# Patient Record
Sex: Male | Born: 1960 | Race: Black or African American | Hispanic: No | Marital: Married | Smoking: Never smoker
Health system: Southern US, Community
[De-identification: ages and names within clinical notes are randomized; demographics above are authoritative.]

## PROBLEM LIST (undated history)

## (undated) DIAGNOSIS — E119 Type 2 diabetes mellitus without complications: Secondary | ICD-10-CM

## (undated) DIAGNOSIS — R7309 Other abnormal glucose: Secondary | ICD-10-CM

## (undated) DIAGNOSIS — I4892 Unspecified atrial flutter: Secondary | ICD-10-CM

## (undated) DIAGNOSIS — I10 Essential (primary) hypertension: Secondary | ICD-10-CM

## (undated) DIAGNOSIS — E785 Hyperlipidemia, unspecified: Secondary | ICD-10-CM

## (undated) HISTORY — DX: Type 2 diabetes mellitus without complications: E11.9

## (undated) HISTORY — DX: Essential (primary) hypertension: I10

## (undated) HISTORY — PX: ARTHROSCOPY KNEE W/ DRILLING: SUR92

## (undated) HISTORY — PX: COLONOSCOPY: SHX174

## (undated) HISTORY — DX: Hyperlipidemia, unspecified: E78.5

## (undated) HISTORY — DX: Unspecified atrial flutter: I48.92

---

## 2003-10-04 ENCOUNTER — Emergency Department (HOSPITAL_COMMUNITY): Admission: EM | Admit: 2003-10-04 | Discharge: 2003-10-05 | Payer: Self-pay | Admitting: Internal Medicine

## 2009-11-18 ENCOUNTER — Emergency Department (HOSPITAL_COMMUNITY): Admission: EM | Admit: 2009-11-18 | Discharge: 2009-11-18 | Payer: Self-pay | Admitting: Family Medicine

## 2009-11-23 ENCOUNTER — Ambulatory Visit (HOSPITAL_COMMUNITY): Admission: RE | Admit: 2009-11-23 | Discharge: 2009-11-23 | Payer: Self-pay | Admitting: Family Medicine

## 2009-11-29 ENCOUNTER — Encounter (HOSPITAL_COMMUNITY): Admission: RE | Admit: 2009-11-29 | Discharge: 2009-12-29 | Payer: Self-pay | Admitting: Family Medicine

## 2013-01-28 ENCOUNTER — Other Ambulatory Visit: Payer: Self-pay | Admitting: *Deleted

## 2013-01-28 MED ORDER — AMLODIPINE BESY-BENAZEPRIL HCL 10-20 MG PO CAPS: 1.0000 | ORAL_CAPSULE | Freq: Every day | ORAL | Status: DC

## 2013-01-28 MED ORDER — PRAVASTATIN SODIUM 80 MG PO TABS: 80.0000 mg | ORAL_TABLET | Freq: Every evening | ORAL | Status: DC

## 2013-01-28 MED ORDER — HYDROCHLOROTHIAZIDE 25 MG PO TABS: 25.0000 mg | ORAL_TABLET | Freq: Every day | ORAL | Status: DC

## 2013-01-29 ENCOUNTER — Encounter: Payer: Self-pay | Admitting: *Deleted

## 2013-01-29 DIAGNOSIS — E782 Mixed hyperlipidemia: Secondary | ICD-10-CM | POA: Insufficient documentation

## 2013-01-29 DIAGNOSIS — E669 Obesity, unspecified: Secondary | ICD-10-CM

## 2013-01-29 DIAGNOSIS — E785 Hyperlipidemia, unspecified: Secondary | ICD-10-CM

## 2013-01-29 DIAGNOSIS — I10 Essential (primary) hypertension: Secondary | ICD-10-CM

## 2013-04-01 ENCOUNTER — Other Ambulatory Visit: Payer: Self-pay | Admitting: *Deleted

## 2013-04-01 MED ORDER — AMLODIPINE BESY-BENAZEPRIL HCL 10-20 MG PO CAPS: 1.0000 | ORAL_CAPSULE | Freq: Every day | ORAL | Status: DC

## 2013-04-01 MED ORDER — PRAVASTATIN SODIUM 80 MG PO TABS: 80.0000 mg | ORAL_TABLET | Freq: Every evening | ORAL | Status: DC

## 2013-04-01 MED ORDER — HYDROCHLOROTHIAZIDE 25 MG PO TABS: 25.0000 mg | ORAL_TABLET | Freq: Every day | ORAL | Status: DC

## 2013-05-31 ENCOUNTER — Other Ambulatory Visit: Payer: Self-pay | Admitting: *Deleted

## 2013-05-31 MED ORDER — AMLODIPINE BESY-BENAZEPRIL HCL 10-20 MG PO CAPS: 1.0000 | ORAL_CAPSULE | Freq: Every day | ORAL | Status: DC

## 2013-05-31 MED ORDER — PRAVASTATIN SODIUM 80 MG PO TABS: 80.0000 mg | ORAL_TABLET | Freq: Every evening | ORAL | Status: DC

## 2013-05-31 MED ORDER — HYDROCHLOROTHIAZIDE 25 MG PO TABS: 25.0000 mg | ORAL_TABLET | Freq: Every day | ORAL | Status: DC

## 2013-08-02 ENCOUNTER — Other Ambulatory Visit: Payer: Self-pay | Admitting: Family Medicine

## 2013-09-03 ENCOUNTER — Telehealth: Payer: Self-pay | Admitting: Family Medicine

## 2013-09-03 DIAGNOSIS — E785 Hyperlipidemia, unspecified: Secondary | ICD-10-CM

## 2013-09-03 DIAGNOSIS — Z79899 Other long term (current) drug therapy: Secondary | ICD-10-CM

## 2013-09-03 DIAGNOSIS — Z125 Encounter for screening for malignant neoplasm of prostate: Secondary | ICD-10-CM

## 2013-09-03 NOTE — Telephone Encounter (Signed)
Patient has a Wellness Exam scheduled for September 16, 2013 and would like to have blood work completed prior.  Call patient when to let him know when he can have his this completed

## 2013-09-04 ENCOUNTER — Other Ambulatory Visit: Payer: Self-pay | Admitting: Family Medicine

## 2013-09-08 NOTE — Telephone Encounter (Signed)
Lip liv m7 psa 

## 2013-09-08 NOTE — Telephone Encounter (Signed)
Blood work ordered in Epic. Patient was notified.  

## 2013-09-13 LAB — LIPID PANEL
Cholesterol: 141 mg/dL (ref 0–200)
HDL: 36 mg/dL — ABNORMAL LOW (ref >39)
LDL Cholesterol: 89 mg/dL (ref 0–99)
Total CHOL/HDL Ratio: 3.9 Ratio
Triglycerides: 78 mg/dL (ref <150)
VLDL: 16 mg/dL (ref 0–40)

## 2013-09-13 LAB — BASIC METABOLIC PANEL
BUN: 13 mg/dL (ref 6–23)
CO2: 31 mEq/L (ref 19–32)
Calcium: 9.6 mg/dL (ref 8.4–10.5)
Chloride: 101 mEq/L (ref 96–112)
Creat: 1.14 mg/dL (ref 0.50–1.35)
Glucose, Bld: 119 mg/dL — ABNORMAL HIGH (ref 70–99)
Potassium: 3.9 mEq/L (ref 3.5–5.3)
Sodium: 137 mEq/L (ref 135–145)

## 2013-09-13 LAB — HEPATIC FUNCTION PANEL
ALT: 26 U/L (ref 0–53)
AST: 20 U/L (ref 0–37)
Albumin: 4 g/dL (ref 3.5–5.2)
Alkaline Phosphatase: 47 U/L (ref 39–117)
Bilirubin, Direct: 0.1 mg/dL (ref 0.0–0.3)
Indirect Bilirubin: 0.4 mg/dL (ref 0.0–0.9)
Total Bilirubin: 0.5 mg/dL (ref 0.3–1.2)
Total Protein: 7.3 g/dL (ref 6.0–8.3)

## 2013-09-14 LAB — PSA: PSA: 0.44 ng/mL (ref <=4.00)

## 2013-09-16 ENCOUNTER — Encounter: Payer: Self-pay | Admitting: Family Medicine

## 2013-09-16 ENCOUNTER — Ambulatory Visit (INDEPENDENT_AMBULATORY_CARE_PROVIDER_SITE_OTHER): Payer: Managed Care, Other (non HMO) | Admitting: Family Medicine

## 2013-09-16 VITALS — BP 150/90 | Ht 73.0 in | Wt 277.4 lb

## 2013-09-16 DIAGNOSIS — Z0000 Encounter for general adult medical examination without abnormal findings: Secondary | ICD-10-CM

## 2013-09-16 MED ORDER — AMLODIPINE BESY-BENAZEPRIL HCL 10-20 MG PO CAPS: ORAL_CAPSULE | ORAL | Status: DC

## 2013-09-16 MED ORDER — HYDROCHLOROTHIAZIDE 25 MG PO TABS: ORAL_TABLET | ORAL | Status: DC

## 2013-09-16 MED ORDER — PRAVASTATIN SODIUM 80 MG PO TABS: ORAL_TABLET | ORAL | Status: DC

## 2013-09-16 NOTE — Progress Notes (Signed)
Subjective:    Patient ID: Brian Ewing, male    DOB: June 04, 1961, 52 y.o.   MRN: [id number]  HPI Patient is here today for annual exam.  He needs a refill on medications. Patient admits to only fair compliance with his diet. Trying to watch his diet. Has cut salt down. Not exercising much at this point unfortunately.  Reports up to date on his colonoscopy Do 2020.  No total slight swelling at the ankles at times. Already had the flu vaccine.  No concerns.  Results for orders placed in visit on 09/03/13  LIPID PANEL      Result Value Range   Cholesterol 141  0 - 200 mg/dL   Triglycerides 78  <956 mg/dL   HDL 36 (*) >21 mg/dL   Total CHOL/HDL Ratio 3.9     VLDL 16  0 - 40 mg/dL   LDL Cholesterol 89  0 - 99 mg/dL  HEPATIC FUNCTION PANEL      Result Value Range   Total Bilirubin 0.5  0.3 - 1.2 mg/dL   Bilirubin, Direct 0.1  0.0 - 0.3 mg/dL   Indirect Bilirubin 0.4  0.0 - 0.9 mg/dL   Alkaline Phosphatase 47  39 - 117 U/L   AST 20  0 - 37 U/L   ALT 26  0 - 53 U/L   Total Protein 7.3  6.0 - 8.3 g/dL   Albumin 4.0  3.5 - 5.2 g/dL  BASIC METABOLIC PANEL      Result Value Range   Sodium 137  135 - 145 mEq/L   Potassium 3.9  3.5 - 5.3 mEq/L   Chloride 101  96 - 112 mEq/L   CO2 31  19 - 32 mEq/L   Glucose, Bld 119 (*) 70 - 99 mg/dL   BUN 13  6 - 23 mg/dL   Creat 3.08  6.57 - 8.46 mg/dL   Calcium 9.6  8.4 - 96.2 mg/dL  PSA      Result Value Range   PSA 0.44  <=4.00 ng/mL     Review of Systems  Constitutional: Negative for fever, activity change and appetite change.  HENT: Negative for congestion and rhinorrhea.   Eyes: Negative for discharge.  Respiratory: Negative for cough and wheezing.   Cardiovascular: Negative for chest pain.  Gastrointestinal: Negative for vomiting, abdominal pain and blood in stool.  Genitourinary: Negative for frequency and difficulty urinating.  Musculoskeletal: Negative for neck pain.  Skin: Negative for rash.  Allergic/Immunologic:  Negative for environmental allergies and food allergies.  Neurological: Negative for weakness and headaches.  Psychiatric/Behavioral: Negative for agitation.       Objective:   Physical Exam  Vitals reviewed. Constitutional: He appears well-developed and well-nourished.  Significant obesity present  HENT:  Head: Normocephalic and atraumatic.  Right Ear: External ear normal.  Left Ear: External ear normal.  Nose: Nose normal.  Mouth/Throat: Oropharynx is clear and moist.  Eyes: EOM are normal. Pupils are equal, round, and reactive to light.  Neck: Normal range of motion. Neck supple. No thyromegaly present.  Cardiovascular: Normal rate, regular rhythm and normal heart sounds.   No murmur heard. Pulmonary/Chest: Effort normal and breath sounds normal. No respiratory distress. He has no wheezes.  Abdominal: Soft. Bowel sounds are normal. He exhibits no distension and no mass. There is no tenderness.  Genitourinary: Penis normal.  Musculoskeletal: Normal range of motion. He exhibits no edema.  Ankles trace edema at most  Lymphadenopathy:    He has no  cervical adenopathy.  Neurological: He is alert. He exhibits normal muscle tone.  Skin: Skin is warm and dry. No erythema.  Psychiatric: He has a normal mood and affect. His behavior is normal. Judgment normal.          Assessment & Plan:  Impression 1 wellness exam. #2 hypertension good control. #3 hyperlipidemia decent control. Plan diet exercise discussed in encourage. Already has had flu shot. Patient mentioned at the end of the visit difficulties with erectile dysfunction. I asked him to return for further discussion regarding this. WSL

## 2013-10-19 ENCOUNTER — Ambulatory Visit: Payer: Managed Care, Other (non HMO) | Admitting: Family Medicine

## 2013-10-20 ENCOUNTER — Ambulatory Visit (INDEPENDENT_AMBULATORY_CARE_PROVIDER_SITE_OTHER): Payer: Managed Care, Other (non HMO) | Admitting: Family Medicine

## 2013-10-20 ENCOUNTER — Encounter: Payer: Self-pay | Admitting: Family Medicine

## 2013-10-20 VITALS — BP 130/90 | Ht 73.0 in | Wt 282.5 lb

## 2013-10-20 DIAGNOSIS — N529 Male erectile dysfunction, unspecified: Secondary | ICD-10-CM

## 2013-10-20 NOTE — Progress Notes (Signed)
   Subjective:    Patient ID: Brian Ewing, male    DOB: 24-Mar-1961, 52 y.o.   MRN: [id number]  Erectile Dysfunction This is a new problem. The current episode started more than 1 month ago. The problem is unchanged. The symptoms are aggravated by medications. Past treatments include nothing. The treatment provided no relief. He has had no adverse reactions caused by medications. There are no known risk factors.    Results for orders placed in visit on 09/03/13  LIPID PANEL      Result Value Range   Cholesterol 141  0 - 200 mg/dL   Triglycerides 78  <409 mg/dL   HDL 36 (*) >81 mg/dL   Total CHOL/HDL Ratio 3.9     VLDL 16  0 - 40 mg/dL   LDL Cholesterol 89  0 - 99 mg/dL  HEPATIC FUNCTION PANEL      Result Value Range   Total Bilirubin 0.5  0.3 - 1.2 mg/dL   Bilirubin, Direct 0.1  0.0 - 0.3 mg/dL   Indirect Bilirubin 0.4  0.0 - 0.9 mg/dL   Alkaline Phosphatase 47  39 - 117 U/L   AST 20  0 - 37 U/L   ALT 26  0 - 53 U/L   Total Protein 7.3  6.0 - 8.3 g/dL   Albumin 4.0  3.5 - 5.2 g/dL  BASIC METABOLIC PANEL      Result Value Range   Sodium 137  135 - 145 mEq/L   Potassium 3.9  3.5 - 5.3 mEq/L   Chloride 101  96 - 112 mEq/L   CO2 31  19 - 32 mEq/L   Glucose, Bld 119 (*) 70 - 99 mg/dL   BUN 13  6 - 23 mg/dL   Creat 1.91  4.78 - 2.95 mg/dL   Calcium 9.6  8.4 - 62.1 mg/dL  PSA      Result Value Range   PSA 0.44  <=4.00 ng/mL   No a merections, No levitra no cialis,  Never went to whole 100 mg tab  Misplaced last rx.  No hx test supplement Review of Systems No chest pain no back pain no abdominal pain no change in bowel habits ROS otherwise negative    Objective:   Physical Exam  Alert lungs clear. Heart regular in rhythm. H&T normal.      Assessment & Plan:  Impression rectal dysfunction somewhat worsening. Long discussion held. At this time we'll hold off on testosterone rationale discussed plan Viagra 100 mg #6 one half to one when necessary. WSL

## 2013-11-06 ENCOUNTER — Other Ambulatory Visit: Payer: Self-pay | Admitting: Family Medicine

## 2014-04-05 ENCOUNTER — Other Ambulatory Visit: Payer: Self-pay | Admitting: Family Medicine

## 2014-05-08 ENCOUNTER — Other Ambulatory Visit: Payer: Self-pay | Admitting: Family Medicine

## 2014-06-05 ENCOUNTER — Other Ambulatory Visit: Payer: Self-pay | Admitting: Family Medicine

## 2014-06-07 ENCOUNTER — Telehealth: Payer: Self-pay | Admitting: Family Medicine

## 2014-06-07 DIAGNOSIS — E785 Hyperlipidemia, unspecified: Secondary | ICD-10-CM

## 2014-06-07 DIAGNOSIS — Z13220 Encounter for screening for lipoid disorders: Secondary | ICD-10-CM

## 2014-06-07 DIAGNOSIS — Z79899 Other long term (current) drug therapy: Secondary | ICD-10-CM

## 2014-06-07 NOTE — Telephone Encounter (Signed)
09/13/13 had: Lip, Liv, Met 7, and PSA

## 2014-06-07 NOTE — Telephone Encounter (Signed)
Patient notified

## 2014-06-07 NOTE — Telephone Encounter (Signed)
Does patient need order for blood work? °

## 2014-06-07 NOTE — Telephone Encounter (Signed)
Lip liv 

## 2014-06-18 LAB — LIPID PANEL
Cholesterol: 129 mg/dL (ref 0–200)
HDL: 37 mg/dL — ABNORMAL LOW (ref >39)
LDL Cholesterol: 81 mg/dL (ref 0–99)
Total CHOL/HDL Ratio: 3.5 Ratio
Triglycerides: 54 mg/dL (ref <150)
VLDL: 11 mg/dL (ref 0–40)

## 2014-06-18 LAB — HEPATIC FUNCTION PANEL
ALT: 33 U/L (ref 0–53)
AST: 26 U/L (ref 0–37)
Albumin: 4.4 g/dL (ref 3.5–5.2)
Alkaline Phosphatase: 46 U/L (ref 39–117)
Bilirubin, Direct: 0.1 mg/dL (ref 0.0–0.3)
Indirect Bilirubin: 0.4 mg/dL (ref 0.2–1.2)
Total Bilirubin: 0.5 mg/dL (ref 0.2–1.2)
Total Protein: 7 g/dL (ref 6.0–8.3)

## 2014-06-27 ENCOUNTER — Encounter: Payer: Self-pay | Admitting: Family Medicine

## 2014-06-27 ENCOUNTER — Ambulatory Visit (INDEPENDENT_AMBULATORY_CARE_PROVIDER_SITE_OTHER): Payer: Managed Care, Other (non HMO) | Admitting: Family Medicine

## 2014-06-27 VITALS — BP 130/84 | Ht 73.0 in | Wt 279.2 lb

## 2014-06-27 DIAGNOSIS — E119 Type 2 diabetes mellitus without complications: Secondary | ICD-10-CM | POA: Insufficient documentation

## 2014-06-27 DIAGNOSIS — N521 Erectile dysfunction due to diseases classified elsewhere: Secondary | ICD-10-CM

## 2014-06-27 DIAGNOSIS — I10 Essential (primary) hypertension: Secondary | ICD-10-CM

## 2014-06-27 DIAGNOSIS — R739 Hyperglycemia, unspecified: Secondary | ICD-10-CM

## 2014-06-27 DIAGNOSIS — R7309 Other abnormal glucose: Secondary | ICD-10-CM

## 2014-06-27 DIAGNOSIS — E785 Hyperlipidemia, unspecified: Secondary | ICD-10-CM

## 2014-06-27 DIAGNOSIS — E669 Obesity, unspecified: Secondary | ICD-10-CM

## 2014-06-27 DIAGNOSIS — N529 Male erectile dysfunction, unspecified: Secondary | ICD-10-CM

## 2014-06-27 LAB — POCT GLYCOSYLATED HEMOGLOBIN (HGB A1C): Hemoglobin A1C: 8.6

## 2014-06-27 NOTE — Patient Instructions (Signed)
Diabetes Mellitus and Food It is important for you to manage your blood sugar (glucose) level. Your blood glucose level can be greatly affected by what you eat. Eating healthier foods in the appropriate amounts throughout the day at about the same time each day will help you control your blood glucose level. It can also help slow or prevent worsening of your diabetes mellitus. Healthy eating may even help you improve the level of your blood pressure and reach or maintain a healthy weight.  HOW CAN FOOD AFFECT ME? Carbohydrates Carbohydrates affect your blood glucose level more than any other type of food. Your dietitian will help you determine how many carbohydrates to eat at each meal and teach you how to count carbohydrates. Counting carbohydrates is important to keep your blood glucose at a healthy level, especially if you are using insulin or taking certain medicines for diabetes mellitus. Alcohol Alcohol can cause sudden decreases in blood glucose (hypoglycemia), especially if you use insulin or take certain medicines for diabetes mellitus. Hypoglycemia can be a life-threatening condition. Symptoms of hypoglycemia (sleepiness, dizziness, and disorientation) are similar to symptoms of having too much alcohol.  If your health care provider has given you approval to drink alcohol, do so in moderation and use the following guidelines:  Women should not have more than one drink per day, and men should not have more than two drinks per day. One drink is equal to:  12 oz of beer.  5 oz of wine.  1 oz of hard liquor.  Do not drink on an empty stomach.  Keep yourself hydrated. Have water, diet soda, or unsweetened iced tea.  Regular soda, juice, and other mixers might contain a lot of carbohydrates and should be counted. WHAT FOODS ARE NOT RECOMMENDED? As you make food choices, it is important to remember that all foods are not the same. Some foods have fewer nutrients per serving than other  foods, even though they might have the same number of calories or carbohydrates. It is difficult to get your body what it needs when you eat foods with fewer nutrients. Examples of foods that you should avoid that are high in calories and carbohydrates but low in nutrients include:  Trans fats (most processed foods list trans fats on the Nutrition Facts label).  Regular soda.  Juice.  Candy.  Sweets, such as cake, pie, doughnuts, and cookies.  Fried foods. WHAT FOODS CAN I EAT? Have nutrient-rich foods, which will nourish your body and keep you healthy. The food you should eat also will depend on several factors, including:  The calories you need.  The medicines you take.  Your weight.  Your blood glucose level.  Your blood pressure level.  Your cholesterol level. You also should eat a variety of foods, including:  Protein, such as meat, poultry, fish, tofu, nuts, and seeds (lean animal proteins are best).  Fruits.  Vegetables.  Dairy products, such as milk, cheese, and yogurt (low fat is best).  Breads, grains, pasta, cereal, rice, and beans.  Fats such as olive oil, trans fat-free margarine, canola oil, avocado, and olives. DOES EVERYONE WITH DIABETES MELLITUS HAVE THE SAME MEAL PLAN? Because every person with diabetes mellitus is different, there is not one meal plan that works for everyone. It is very important that you meet with a dietitian who will help you create a meal plan that is just right for you. Document Released: 07/25/2005 Document Revised: 11/02/2013 Document Reviewed: 09/24/2013 Exit[employer] Patient Information 2015 [employer]. This   information is not intended to replace advice given to you by your health care provider. Make sure you discuss any questions you have with your health care provider. Diabetes and Exercise Exercising regularly is important. It is not just about losing weight. It has many health benefits, such as:  Improving your overall  fitness, flexibility, and endurance.  Increasing your bone density.  Helping with weight control.  Decreasing your body fat.  Increasing your muscle strength.  Reducing stress and tension.  Improving your overall health. People with diabetes who exercise gain additional benefits because exercise:  Reduces appetite.  Improves the body's use of blood sugar (glucose).  Helps lower or control blood glucose.  Decreases blood pressure.  Helps control blood lipids (such as cholesterol and triglycerides).  Improves the body's use of the hormone insulin by:  Increasing the body's insulin sensitivity.  Reducing the body's insulin needs.  Decreases the risk for heart disease because exercising:  Lowers cholesterol and triglycerides levels.  Increases the levels of good cholesterol (such as high-density lipoproteins [HDL]) in the body.  Lowers blood glucose levels. YOUR ACTIVITY PLAN  Choose an activity that you enjoy and set realistic goals. Your health care provider or diabetes educator can help you make an activity plan that works for you. Exercise regularly as directed by your health care provider. This includes:  Performing resistance training twice a week such as push-ups, sit-ups, lifting weights, or using resistance bands.  Performing 150 minutes of cardio exercises each week such as walking, running, or playing sports.  Staying active and spending no more than 90 minutes at one time being inactive. Even short bursts of exercise are good for you. Three 10-minute sessions spread throughout the day are just as beneficial as a single 30-minute session. Some exercise ideas include:  Taking the dog for a walk.  Taking the stairs instead of the elevator.  Dancing to your favorite song.  Doing an exercise video.  Doing your favorite exercise with a friend. RECOMMENDATIONS FOR EXERCISING WITH TYPE 1 OR TYPE 2 DIABETES   Check your blood glucose before exercising. If  blood glucose levels are greater than 240 mg/dL, check for urine ketones. Do not exercise if ketones are present.  Avoid injecting insulin into areas of the body that are going to be exercised. For example, avoid injecting insulin into:  The arms when playing tennis.  The legs when jogging.  Keep a record of:  Food intake before and after you exercise.  Expected peak times of insulin action.  Blood glucose levels before and after you exercise.  The type and amount of exercise you have done.  Review your records with your health care provider. Your health care provider will help you to develop guidelines for adjusting food intake and insulin amounts before and after exercising.  If you take insulin or oral hypoglycemic agents, watch for signs and symptoms of hypoglycemia. They include:  Dizziness.  Shaking.  Sweating.  Chills.  Confusion.  Drink plenty of water while you exercise to prevent dehydration or heat stroke. Body water is lost during exercise and must be replaced.  Talk to your health care provider before starting an exercise program to make sure it is safe for you. Remember, almost any type of activity is better than none. Document Released: 01/18/2004 Document Revised: 03/14/2014 Document Reviewed: 04/06/2013 Exit[employer] Patient Information 2015 [employer]. This information is not intended to replace advice given to you by your health care provider. Make sure you discuss any   questions you have with your health care provider.  

## 2014-06-27 NOTE — Progress Notes (Signed)
   Subjective:    Patient ID: Brian NatalLarry W Ewing, male    DOB: [birth year], 53 y.o.   MRN: [id number]  Hypertension This is a chronic problem. The current episode started more than 1 year ago. The problem has been gradually improving since onset. The problem is controlled. There are no associated agents to hypertension. There are no known risk factors for coronary artery disease. Treatments tried: amlodipine-benazepril. The current treatment provides significant improvement. There are no compliance problems.   Patient has had bloodwork completed.  Patient has no concerns at this time.   Patient's on medication for high cholesterol. Handling medicine well. No obvious side effects. Mostly watch his diet. Not exercising however.  History of elevated glucose in the past. Positive family history of diabetes.  Some [place] urea these days no polydipsia  Results for orders placed in visit on 06/07/14  LIPID PANEL      Result Value Ref Range   Cholesterol 129  0 - 200 mg/dL   Triglycerides 54  <409<150 mg/dL   HDL 37 (*) >81>39 mg/dL   Total CHOL/HDL Ratio 3.5     VLDL 11  0 - 40 mg/dL   LDL Cholesterol 81  0 - 99 mg/dL  HEPATIC FUNCTION PANEL      Result Value Ref Range   Total Bilirubin 0.5  0.2 - 1.2 mg/dL   Bilirubin, Direct 0.1  0.0 - 0.3 mg/dL   Indirect Bilirubin 0.4  0.2 - 1.2 mg/dL   Alkaline Phosphatase 46  39 - 117 U/L   AST 26  0 - 37 U/L   ALT 33  0 - 53 U/L   Total Protein 7.0  6.0 - 8.3 g/dL   Albumin 4.4  3.5 - 5.2 g/dL    Uses  Ibuprofen as needed for knees and hands aching  Not so good with exercise these days.   Review of Systems No chest pain no headache no back pain no weight loss some weight gain no abdominal pain no change in bowel habits no blood in stool ROS otherwise    Objective:   Physical Exam  Alert no apparent distress. Vitals stable. HEENT normal. Lungs clear. Heart regular in rhythm. Ankles without edema.  Results for orders placed in visit on 06/27/14    POCT GLYCOSYLATED HEMOGLOBIN (HGB A1C)      Result Value Ref Range   Hemoglobin A1C 8.6         Assessment & Plan:  Impression #1 type 2 diabetes new diagnosis. Discussed at great length. Including natural history. Treatment. Diet. Potential medications. Self-monitoring and sugars. Educational sessions. Long-term complications etc. #2 hypertension good control discussed #3 hyperlipidemia good control. Plan 35-40 minutes spent most in discussion. Glucose monitor prescribed. Return in 2 weeks. His morning glucoses not under 150 will consider adding medicine such as metformin. Multiple questions answered. WSL

## 2014-06-30 ENCOUNTER — Encounter (HOSPITAL_COMMUNITY): Payer: Self-pay | Admitting: Dietician

## 2014-06-30 NOTE — Progress Notes (Signed)
[facility] Diabetes Class Completion  Date:June 30, 2014  Time: 1730  Pt attended [facility] Education Class on June 30, 2014.   Patient was educated on the following topics:   -Survival skills (signs and symptoms of hyperglycemia and hypoglycemia, treatment for hypoglycemia, ideal levels for fasting and postprandial blood sugars, goal Hgb A1c level, foot care basics)  -Recommendations for physical activity   -Carbohydrate metabolism in relation to diabetes   -Meal planning (sources of carbohydrate, carbohydrate counting, meal planning strategies, food label reading, and portion control).  Handouts provided:  -"Diabetes and You: Taking Charge of Your Health"  -"Carbohydrate Counting and Meal Planning"  -"Your Guide to Better Office Visits"   Brian Ewing, RD, LDN  

## 2014-07-12 ENCOUNTER — Encounter: Payer: Self-pay | Admitting: Family Medicine

## 2014-07-12 ENCOUNTER — Ambulatory Visit (INDEPENDENT_AMBULATORY_CARE_PROVIDER_SITE_OTHER): Payer: Managed Care, Other (non HMO) | Admitting: Family Medicine

## 2014-07-12 VITALS — BP 112/80 | Ht 73.0 in | Wt 268.0 lb

## 2014-07-12 DIAGNOSIS — E119 Type 2 diabetes mellitus without complications: Secondary | ICD-10-CM

## 2014-07-12 DIAGNOSIS — Z23 Encounter for immunization: Secondary | ICD-10-CM

## 2014-07-12 NOTE — Patient Instructions (Signed)
Recommend continued fasting sugars about twice per wk spread out.  Continue your exercise regimen and diet  Long term goal is most fasting sugars under 130, this generally translates to good HgbA1C and good long term management

## 2014-07-12 NOTE — Progress Notes (Signed)
   Subjective:    Patient ID: Brian Ewing, male    DOB: [birth year], 53 y.o.   MRN: [id number]  HPISeen on 8/17 and was diagnosed with diabetes. Not currently on diabetic med. Checking blood sugar every 2 or 3 days. Pt brought in readings.   Pt now watching diet hard  Sugars havedropped from 200 to near 100 each morning  On an indoor bike once or twice per day for five min  Patient went to the diabetes new educational session. Alert a lot about exercise. Warning about diet. Warned about long-term complications.  Using his own monitor 2-3 times per week. Generally morning sugars have dropped nicely.  No longer noticing any frequent urination. No blurred vision etc.     Review of Systems No headache no chest pain no back pain no abdominal pain no shortness breath ROS otherwise negative.    Objective:   Physical Exam Alert no apparent distress. Lungs clear. Heart regular rate and rhythm. HEENT normal. C. diabetic foot exam.       Assessment & Plan:  Impression 1 type 2 diabetes new onset discussed at great length. 25 minutes spent most in discussion. For now hold off on medications. Patient to work hard on diet. Patient work hard on exercise. With ongoing weight loss expect continued good control of sugars. Eventually his honeymoon phase may be over and patient may require intervention he expresses understanding. Yearly eye exam, gets yearly flu shot. pneumo vac recommended today. WSL

## 2014-07-14 ENCOUNTER — Other Ambulatory Visit: Payer: Self-pay | Admitting: Family Medicine

## 2014-07-20 LAB — HM DIABETES EYE EXAM

## 2014-08-12 ENCOUNTER — Other Ambulatory Visit: Payer: Self-pay | Admitting: Family Medicine

## 2014-09-12 ENCOUNTER — Other Ambulatory Visit: Payer: Self-pay | Admitting: Family Medicine

## 2014-09-15 ENCOUNTER — Encounter: Payer: Self-pay | Admitting: Family Medicine

## 2014-10-08 ENCOUNTER — Other Ambulatory Visit: Payer: Self-pay | Admitting: Family Medicine

## 2014-10-11 ENCOUNTER — Encounter: Payer: Self-pay | Admitting: Family Medicine

## 2014-10-11 ENCOUNTER — Ambulatory Visit (INDEPENDENT_AMBULATORY_CARE_PROVIDER_SITE_OTHER): Payer: Managed Care, Other (non HMO) | Admitting: Family Medicine

## 2014-10-11 VITALS — BP 138/94 | Ht 73.0 in | Wt 232.0 lb

## 2014-10-11 DIAGNOSIS — Z125 Encounter for screening for malignant neoplasm of prostate: Secondary | ICD-10-CM

## 2014-10-11 DIAGNOSIS — I10 Essential (primary) hypertension: Secondary | ICD-10-CM

## 2014-10-11 DIAGNOSIS — N529 Male erectile dysfunction, unspecified: Secondary | ICD-10-CM

## 2014-10-11 DIAGNOSIS — Z79899 Other long term (current) drug therapy: Secondary | ICD-10-CM

## 2014-10-11 DIAGNOSIS — E119 Type 2 diabetes mellitus without complications: Secondary | ICD-10-CM

## 2014-10-11 DIAGNOSIS — R739 Hyperglycemia, unspecified: Secondary | ICD-10-CM

## 2014-10-11 DIAGNOSIS — E785 Hyperlipidemia, unspecified: Secondary | ICD-10-CM

## 2014-10-11 LAB — HM DIABETES EYE EXAM

## 2014-10-11 LAB — POCT GLYCOSYLATED HEMOGLOBIN (HGB A1C): HEMOGLOBIN A1C: 6.1

## 2014-10-11 NOTE — Progress Notes (Signed)
   Subjective:    Patient ID: Brian Ewing, male    DOB: Jun 10, 1961, 53 y.o.   MRN: [id number]  Diabetes He presents for his follow-up diabetic visit. He has type 2 diabetes mellitus. There are no hypoglycemic associated symptoms. There are no diabetic associated symptoms. Symptoms are stable. Current diabetic treatment includes diet. He is compliant with treatment all of the time. He is following a generally healthy diet. Exercise: wants to become more active. He monitors blood glucose at home 1-2 x per week. His overall blood glucose range is 70-90 mg/dl. He does not see a podiatrist.Eye exam is current.   Has cut down sugars in the diet  Has lost weight on new diet.  Sticking with bp med  Eye doc visit going back this Tuesday     Patient sticking with blood pressure medication. No obvious side effects. Has cut down his salt intake. Not exercising much yet.  Compliant with lipid medication. No obvious side effects in this regard.  Ongoing erectile dysfunction. Patient curious about whether he could use generic Viagra in the 20 mg tablet form.  Review of Systems No headache no chest pain no back pain no abdominal pain no change in bowel habits    Objective:   Physical Exam  Alert no acute distress. Lungs clear. Heart regular rate and rhythm. HEENT normal. Ankles without edema.  C diabetic foot exam.  Results for orders placed or performed in visit on 10/11/14  POCT glycosylated hemoglobin (Hb A1C)  Result Value Ref Range   Hemoglobin A1C 6.1        Assessment & Plan:  Impression #1 type 2 diabetes. Tremendous improvement with patient's efforts. Discussed #2 hypertension stable numbers now good. #3 hyperlipidemia status uncertain. #4 erectile dysfunction discussed plan trial of generic sildenafil 20 mg 2-1/2 by mouth when necessary. Maintain other medications. Diet exercise discussed. Follow-up as scheduled. WSL

## 2014-12-08 ENCOUNTER — Other Ambulatory Visit: Payer: Self-pay | Admitting: Family Medicine

## 2014-12-27 ENCOUNTER — Other Ambulatory Visit: Payer: Self-pay | Admitting: Family Medicine

## 2015-01-07 LAB — BASIC METABOLIC PANEL
BUN: 19 mg/dL (ref 6–23)
CALCIUM: 9.4 mg/dL (ref 8.4–10.5)
CO2: 30 mEq/L (ref 19–32)
CREATININE: 1.18 mg/dL (ref 0.50–1.35)
Chloride: 105 mEq/L (ref 96–112)
GLUCOSE: 76 mg/dL (ref 70–99)
Potassium: 3.7 mEq/L (ref 3.5–5.3)
SODIUM: 142 meq/L (ref 135–145)

## 2015-01-07 LAB — LIPID PANEL
Cholesterol: 97 mg/dL (ref 0–200)
HDL: 42 mg/dL (ref >=40)
LDL Cholesterol: 49 mg/dL (ref 0–99)
TRIGLYCERIDES: 28 mg/dL (ref <150)
Total CHOL/HDL Ratio: 2.3 Ratio
VLDL: 6 mg/dL (ref 0–40)

## 2015-01-07 LAB — HEPATIC FUNCTION PANEL
ALT: 24 U/L (ref 0–53)
AST: 21 U/L (ref 0–37)
Albumin: 4.2 g/dL (ref 3.5–5.2)
Alkaline Phosphatase: 42 U/L (ref 39–117)
BILIRUBIN DIRECT: 0.2 mg/dL (ref 0.0–0.3)
Indirect Bilirubin: 0.5 mg/dL (ref 0.2–1.2)
TOTAL PROTEIN: 7.1 g/dL (ref 6.0–8.3)
Total Bilirubin: 0.7 mg/dL (ref 0.2–1.2)

## 2015-01-07 LAB — MICROALBUMIN, URINE: Microalb, Ur: 1 mg/dL (ref <2.0)

## 2015-01-09 LAB — PSA: PSA: 0.35 ng/mL (ref <=4.00)

## 2015-01-10 ENCOUNTER — Encounter: Payer: Self-pay | Admitting: Family Medicine

## 2015-01-10 ENCOUNTER — Ambulatory Visit (INDEPENDENT_AMBULATORY_CARE_PROVIDER_SITE_OTHER): Payer: Managed Care, Other (non HMO) | Admitting: Family Medicine

## 2015-01-10 VITALS — BP 122/82 | Ht 73.0 in | Wt 222.2 lb

## 2015-01-10 DIAGNOSIS — I10 Essential (primary) hypertension: Secondary | ICD-10-CM | POA: Diagnosis not present

## 2015-01-10 DIAGNOSIS — E785 Hyperlipidemia, unspecified: Secondary | ICD-10-CM

## 2015-01-10 DIAGNOSIS — Z0000 Encounter for general adult medical examination without abnormal findings: Secondary | ICD-10-CM

## 2015-01-10 DIAGNOSIS — E119 Type 2 diabetes mellitus without complications: Secondary | ICD-10-CM

## 2015-01-10 MED ORDER — SILDENAFIL CITRATE 20 MG PO TABS: ORAL_TABLET | ORAL | Status: DC

## 2015-01-10 MED ORDER — PRAVASTATIN SODIUM 80 MG PO TABS: ORAL_TABLET | ORAL | Status: DC

## 2015-01-10 MED ORDER — HYDROCHLOROTHIAZIDE 25 MG PO TABS: ORAL_TABLET | ORAL | Status: DC

## 2015-01-10 MED ORDER — AMLODIPINE BESY-BENAZEPRIL HCL 10-20 MG PO CAPS: 1.0000 | ORAL_CAPSULE | Freq: Every day | ORAL | Status: DC

## 2015-01-10 NOTE — Progress Notes (Signed)
Subjective:    Patient ID: Brian Ewing, male    DOB: [birth year], 54 y.o.   MRN: [id number]  HPI The patient comes in today for a wellness visit.    A review of their health history was completed.  A review of medications was also completed.  Any needed refills; not sure but prob.  Eating habits: healthy eating  Falls/  MVA accidents in past few months: none  Regular exercise: yes  Specialist pt sees on regular basis: no  Preventative health issues were discussed.   Additional concerns: none  Results for orders placed or performed in visit on 10/11/14  Lipid panel  Result Value Ref Range   Cholesterol 97 0 - 200 mg/dL   Triglycerides 28 <160 mg/dL   HDL 42 >=73 mg/dL   Total CHOL/HDL Ratio 2.3 Ratio   VLDL 6 0 - 40 mg/dL   LDL Cholesterol 49 0 - 99 mg/dL  Hepatic function panel  Result Value Ref Range   Total Bilirubin 0.7 0.2 - 1.2 mg/dL   Bilirubin, Direct 0.2 0.0 - 0.3 mg/dL   Indirect Bilirubin 0.5 0.2 - 1.2 mg/dL   Alkaline Phosphatase 42 39 - 117 U/L   AST 21 0 - 37 U/L   ALT 24 0 - 53 U/L   Total Protein 7.1 6.0 - 8.3 g/dL   Albumin 4.2 3.5 - 5.2 g/dL  Basic metabolic panel  Result Value Ref Range   Sodium 142 135 - 145 mEq/L   Potassium 3.7 3.5 - 5.3 mEq/L   Chloride 105 96 - 112 mEq/L   CO2 30 19 - 32 mEq/L   Glucose, Bld 76 70 - 99 mg/dL   BUN 19 6 - 23 mg/dL   Creat 7.10 6.26 - 9.48 mg/dL   Calcium 9.4 8.4 - 54.6 mg/dL  PSA  Result Value Ref Range   PSA 0.35 <=4.00 ng/mL  Microalbumin, urine  Result Value Ref Range   Microalb, Ur 1.0 <2.0 mg/dL  POCT glycosylated hemoglobin (Hb A1C)  Result Value Ref Range   Hemoglobin A1C 6.1    utd on colonoscopy  Patient compliant with blood pressure medicine. Watching salt intake. No obvious side effects.   patient compliant with lipid medication. Handling it well. No obvious side effects. Exercise s o so  Diet a lot tighter  Pneum vaccine given    Review of Systems  Constitutional:  Negative for fever, activity change and appetite change.  HENT: Negative for congestion and rhinorrhea.   Eyes: Negative for discharge.  Respiratory: Negative for cough and wheezing.   Cardiovascular: Negative for chest pain.  Gastrointestinal: Negative for vomiting, abdominal pain and blood in stool.  Genitourinary: Negative for frequency and difficulty urinating.  Musculoskeletal: Negative for neck pain.  Skin: Negative for rash.  Allergic/Immunologic: Negative for environmental allergies and food allergies.  Neurological: Negative for weakness and headaches.  Psychiatric/Behavioral: Negative for agitation.  All other systems reviewed and are negative.      Objective:   Physical Exam  Constitutional: He appears well-developed and well-nourished.  HENT:  Head: Normocephalic and atraumatic.  Right Ear: External ear normal.  Left Ear: External ear normal.  Nose: Nose normal.  Mouth/Throat: Oropharynx is clear and moist.  Eyes: EOM are normal. Pupils are equal, round, and reactive to light.  Neck: Normal range of motion. Neck supple. No thyromegaly present.  Cardiovascular: Normal rate, regular rhythm and normal heart sounds.   No murmur heard. Pulmonary/Chest: Effort normal and breath sounds normal.  No respiratory distress. He has no wheezes.  Abdominal: Soft. Bowel sounds are normal. He exhibits no distension and no mass. There is no tenderness.  Genitourinary: Prostate normal and penis normal.  Musculoskeletal: Normal range of motion. He exhibits no edema.  Lymphadenopathy:    He has no cervical adenopathy.  Neurological: He is alert. He exhibits normal muscle tone.  Skin: Skin is warm and dry. No erythema.  Psychiatric: He has a normal mood and affect. His behavior is normal. Judgment normal.  Vitals reviewed.         Assessment & Plan:  Impression 1 wellness exam #2 hypertension good control #3 hyperlipidemia good control plan diet exercise discussed. Blood work  discussed. Maintain same chronic medications recheck in 6 months. WSL

## 2015-01-20 ENCOUNTER — Encounter: Payer: Self-pay | Admitting: Family Medicine

## 2015-01-25 ENCOUNTER — Encounter: Payer: Self-pay | Admitting: Family Medicine

## 2015-01-25 ENCOUNTER — Telehealth: Payer: Self-pay | Admitting: *Deleted

## 2015-01-25 MED ORDER — ONDANSETRON 4 MG PO TBDP
4.0000 mg | ORAL_TABLET | Freq: Four times a day (QID) | ORAL | Status: DC | PRN
Start: 2015-01-25 — End: 2015-01-25

## 2015-01-25 MED ORDER — ONDANSETRON 4 MG PO TBDP: 4.0000 mg | ORAL_TABLET | Freq: Four times a day (QID) | ORAL | Status: DC | PRN

## 2015-01-25 NOTE — Telephone Encounter (Signed)
Patient notified and verbalized understanding. 

## 2015-01-25 NOTE — Telephone Encounter (Signed)
Can you please write WE for this patient for today. Thanks!

## 2015-01-25 NOTE — Telephone Encounter (Addendum)
Having vomiting and diarrhea. Started last night. Not able to keep anything down. Weakness. No fever.  Can something be called into [facility]. Wife having the same symptoms.

## 2015-01-25 NOTE — Telephone Encounter (Signed)
See response for spouse

## 2015-03-01 ENCOUNTER — Ambulatory Visit (INDEPENDENT_AMBULATORY_CARE_PROVIDER_SITE_OTHER): Payer: Managed Care, Other (non HMO) | Admitting: Family Medicine

## 2015-03-01 ENCOUNTER — Encounter: Payer: Self-pay | Admitting: Family Medicine

## 2015-03-01 VITALS — BP 110/74 | Temp 98.3°F | Ht 73.0 in | Wt 212.4 lb

## 2015-03-01 DIAGNOSIS — G5711 Meralgia paresthetica, right lower limb: Secondary | ICD-10-CM | POA: Diagnosis not present

## 2015-03-01 MED ORDER — ETODOLAC 400 MG PO TABS
400.0000 mg | ORAL_TABLET | Freq: Two times a day (BID) | ORAL | Status: DC
Start: 2015-03-01 — End: 2016-01-12

## 2015-03-01 NOTE — Patient Instructions (Signed)
This is called neuralgia paraesthetica and some call  It meralgia parasthetic  WEB MD

## 2015-03-01 NOTE — Progress Notes (Signed)
   Subjective:    Patient ID: Arnaldo NatalLarry W Lites, male    DOB: 1961-08-21, 54 y.o.   MRN: [id number]  Leg Pain  The incident occurred more than 1 week ago. The incident occurred at work. There was no injury mechanism. The pain is present in the right leg. The quality of the pain is described as aching. The pain is moderate. The pain has been intermittent since onset. He reports no foreign bodies present. The symptoms are aggravated by weight bearing. He has tried NSAIDs for the symptoms. The treatment provided no relief.   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   Patient does 10 were else fairly tight on his pants. 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 Next  Develops a throbbing aching numb-like discomfort in the right lateral thigh. This extends towards the knee. Next  Recalls no injury. No weakness. Review of Systems No headache no chest pain no back pain no shortness of breath    Objective:   Physical Exam Alert vital stable blood pressure good HEENT normal. Lungs clear. Heart regular in rhythm. Right lateral thigh sensation intact pulses good. Strength intact no edema. Negative Homans sign.       Assessment & Plan:  Impression meralgia paresthetica discussed at length. 25 minutes spent most in discussion. Anti-inflammatory medicine prescribed. Reduction measures discussed at length. No studies at this time [facility]

## 2015-07-13 ENCOUNTER — Encounter: Payer: Self-pay | Admitting: Family Medicine

## 2015-07-13 ENCOUNTER — Ambulatory Visit (INDEPENDENT_AMBULATORY_CARE_PROVIDER_SITE_OTHER): Payer: Managed Care, Other (non HMO) | Admitting: Family Medicine

## 2015-07-13 VITALS — BP 118/80 | Ht 73.0 in | Wt 201.0 lb

## 2015-07-13 DIAGNOSIS — E119 Type 2 diabetes mellitus without complications: Secondary | ICD-10-CM

## 2015-07-13 DIAGNOSIS — E785 Hyperlipidemia, unspecified: Secondary | ICD-10-CM | POA: Diagnosis not present

## 2015-07-13 DIAGNOSIS — Z79899 Other long term (current) drug therapy: Secondary | ICD-10-CM | POA: Diagnosis not present

## 2015-07-13 LAB — POCT GLYCOSYLATED HEMOGLOBIN (HGB A1C): Hemoglobin A1C: 5

## 2015-07-13 MED ORDER — AMLODIPINE BESY-BENAZEPRIL HCL 10-20 MG PO CAPS: 1.0000 | ORAL_CAPSULE | Freq: Every day | ORAL | Status: DC

## 2015-07-13 MED ORDER — HYDROCHLOROTHIAZIDE 25 MG PO TABS: ORAL_TABLET | ORAL | Status: DC

## 2015-07-13 MED ORDER — PRAVASTATIN SODIUM 80 MG PO TABS: ORAL_TABLET | ORAL | Status: DC

## 2015-07-13 NOTE — Progress Notes (Signed)
   Subjective:    Patient ID: Brian Ewing, male    DOB: [birth year], 54 y.o.   MRN: [id number]  Diabetes He presents for his follow-up diabetic visit. He has type 2 diabetes mellitus. He is compliant with treatment all of the time. Exercise: works in the yard. His breakfast blood glucose range is generally 70-90 mg/dl. He does not see a podiatrist.Eye exam is current.   Sugars overall 70s and 80  Exercising regularly  Back pain and pain in left thigh. Taking lodine. See prior notes. Coming:. Now more back pain radiating down to the leg. Occasionally below the left knee. Pain is quite severe and tooth achy at times. Comes and goes    Results for orders placed or performed in visit on 07/13/15  POCT glycosylated hemoglobin (Hb A1C)  Result Value Ref Range   Hemoglobin A1C 5.0   HM DIABETES EYE EXAM  Result Value Ref Range   HM Diabetic Eye Exam No Retinopathy No Retinopathy   Patient states compliant with lipid medication. No obvious side effects. Working on fat intake. Does not miss a dose of medication.  Review of Systems No headache no chest pain no back pain no abdominal pain no change in bowel habits no blood in stool   alert no acute distress vital stable. H&T normal. Lungs clear. Heart regular in rhythm. Left leg positive straight leg raise. Deep tendon reflexes intact    Objective:   Physical Exam   See above     Assessment & Plan:  Impression 1 left leg sciatica. My working diagnosis was neuralgia paresthetica however this now appears to be more true left leg sciatica discussed at length #2 type 2 diabetes excellent control #3 hypertension good control #4 hyperlipidemia status uncertain plan appropriate blood work. Diet exercise discussed. Patient for now wishes to hold off on MRI which is reasonable with waxing and waning of symptomatology WSL

## 2015-07-13 NOTE — Patient Instructions (Signed)
A1C results today 5.0

## 2015-07-14 LAB — HEPATIC FUNCTION PANEL
ALBUMIN: 4.6 g/dL (ref 3.5–5.5)
ALK PHOS: 49 IU/L (ref 39–117)
ALT: 28 IU/L (ref 0–44)
AST: 29 IU/L (ref 0–40)
Bilirubin Total: 0.6 mg/dL (ref 0.0–1.2)
Bilirubin, Direct: 0.21 mg/dL (ref 0.00–0.40)
Total Protein: 7.4 g/dL (ref 6.0–8.5)

## 2015-07-14 LAB — LIPID PANEL
CHOLESTEROL TOTAL: 118 mg/dL (ref 100–199)
Chol/HDL Ratio: 2.2 ratio units (ref 0.0–5.0)
HDL: 53 mg/dL (ref >39)
LDL CALC: 59 mg/dL (ref 0–99)
Triglycerides: 30 mg/dL (ref 0–149)
VLDL Cholesterol Cal: 6 mg/dL (ref 5–40)

## 2015-07-17 ENCOUNTER — Encounter: Payer: Self-pay | Admitting: Family Medicine

## 2015-07-30 ENCOUNTER — Emergency Department (HOSPITAL_COMMUNITY): Payer: Managed Care, Other (non HMO)

## 2015-07-30 ENCOUNTER — Encounter (HOSPITAL_COMMUNITY): Payer: Self-pay | Admitting: Emergency Medicine

## 2015-07-30 ENCOUNTER — Emergency Department (HOSPITAL_COMMUNITY)
Admission: EM | Admit: 2015-07-30 | Discharge: 2015-07-30 | Disposition: A | Discharge status: Home / Self Care | Payer: Managed Care, Other (non HMO) | Attending: Emergency Medicine | Admitting: Emergency Medicine

## 2015-07-30 DIAGNOSIS — Z7982 Long term (current) use of aspirin: Secondary | ICD-10-CM | POA: Insufficient documentation

## 2015-07-30 DIAGNOSIS — M79605 Pain in left leg: Secondary | ICD-10-CM | POA: Diagnosis present

## 2015-07-30 DIAGNOSIS — M1612 Unilateral primary osteoarthritis, left hip: Secondary | ICD-10-CM | POA: Insufficient documentation

## 2015-07-30 DIAGNOSIS — M5432 Sciatica, left side: Secondary | ICD-10-CM | POA: Diagnosis not present

## 2015-07-30 DIAGNOSIS — Z79899 Other long term (current) drug therapy: Secondary | ICD-10-CM | POA: Diagnosis not present

## 2015-07-30 HISTORY — DX: Other abnormal glucose: R73.09

## 2015-07-30 MED ORDER — DEXAMETHASONE 4 MG PO TABS: 4.0000 mg | ORAL_TABLET | Freq: Two times a day (BID) | ORAL | Status: DC

## 2015-07-30 MED ORDER — HYDROCODONE-ACETAMINOPHEN 5-325 MG PO TABS: ORAL_TABLET | ORAL | Status: DC

## 2015-07-30 MED ORDER — DIAZEPAM 5 MG PO TABS
5.0000 mg | ORAL_TABLET | Freq: Once | ORAL | Status: AC
  Administered 2015-07-30: 5 mg via ORAL
  Filled 2015-07-30: qty 1

## 2015-07-30 MED ORDER — DIAZEPAM 5 MG PO TABS: ORAL_TABLET | ORAL | Status: DC

## 2015-07-30 MED ORDER — HYDROCODONE-ACETAMINOPHEN 5-325 MG PO TABS
2.0000 | ORAL_TABLET | Freq: Once | ORAL | Status: AC
  Administered 2015-07-30: 2 via ORAL
  Filled 2015-07-30: qty 2

## 2015-07-30 MED ORDER — PREDNISONE 50 MG PO TABS
60.0000 mg | ORAL_TABLET | Freq: Once | ORAL | Status: AC
  Administered 2015-07-30: 60 mg via ORAL
  Filled 2015-07-30 (×2): qty 1

## 2015-07-30 NOTE — ED Notes (Signed)
Patient with no complaints at this time. Respirations even and unlabored. Skin warm/dry. Discharge instructions reviewed with patient at this time. Patient given opportunity to voice concerns/ask questions. Patient discharged at this time and left Emergency Department with steady gait.   

## 2015-07-30 NOTE — ED Notes (Signed)
Pt c/o lower back pain that radiates down into his left leg. Pt reports the pain in constant and is worse upon standing and walking. Pt denies injury. Pt was seen by Dr. Gerda Diss, PCP, and given pain medication without much relief. Pt presents with limp upon walking.

## 2015-07-30 NOTE — ED Notes (Signed)
PA at bedside.

## 2015-07-30 NOTE — Discharge Instructions (Signed)
Your MRI was scheduled for September 29 at 9 AM. Please rest your back is much as possible. Use your current medication as ordered. Please add Decadron, Norco, Valium. Norco and value may cause drowsiness, please do not 3 machinery, drive a vehicle, but dissipated activities requiring concentration when taking these medications. Sciatica Sciatica is pain, weakness, numbness, or tingling along your sciatic nerve. The nerve starts in the lower back and runs down the back of each leg. Nerve damage or certain conditions pinch or put pressure on the sciatic nerve. This causes the pain, weakness, and other discomforts of sciatica. HOME CARE   Only take medicine as told by your doctor.  Apply ice to the affected area for 20 minutes. Do this 3-4 times a day for the first 48-72 hours. Then try heat in the same way.  Exercise, stretch, or do your usual activities if these do not make your pain worse.  Go to physical therapy as told by your doctor.  Keep all doctor visits as told.  Do not wear high heels or shoes that are not supportive.  Get a firm mattress if your mattress is too soft to lessen pain and discomfort. GET HELP RIGHT AWAY IF:   You cannot control when you poop (bowel movement) or pee (urinate).  You have more weakness in your lower back, lower belly (pelvis), butt (buttocks), or legs.  You have redness or puffiness (swelling) of your back.  You have a burning feeling when you pee.  You have pain that gets worse when you lie down.  You have pain that wakes you from your sleep.  Your pain is worse than past pain.  Your pain lasts longer than 4 weeks.  You are suddenly losing weight without reason. MAKE SURE YOU:   Understand these instructions.  Will watch this condition.  Will get help right away if you are not doing well or get worse. Document Released: 08/06/2008 Document Revised: 04/28/2012 Document Reviewed: 03/08/2012 [facility] Patient Information 2015 [place],  [place]. This information is not intended to replace advice given to you by your health care provider. Make sure you discuss any questions you have with your health care provider.

## 2015-07-30 NOTE — ED Notes (Signed)
Patient with c/o lower back and left leg pain that he describes as sharp/shooting pain. Has seen Dr Gerda Diss for same and given pain medication. Patient ambulatory with limp.

## 2015-07-30 NOTE — ED Provider Notes (Signed)
CSN: [id number]     Arrival date & time 07/30/15  0919 History   First MD Initiated Contact with Patient 07/30/15 [phone]     Chief Complaint  Patient presents with  . Leg Pain     (Consider location/radiation/quality/duration/timing/severity/associated sxs/prior Treatment) Patient is a 54 y.o. male presenting with leg pain. The history is provided by the patient.  Leg Pain Location:  Leg Time since incident:  2 months Injury: no   Leg location:  L leg Pain details:    Quality:  Aching   Severity:  Moderate   Onset quality:  Gradual   Duration:  2 months   Timing:  Intermittent   Progression:  Worsening Chronicity:  Chronic Dislocation: no   Relieved by:  Nothing Worsened by:  Bearing weight Ineffective treatments:  NSAIDs Associated symptoms: back pain   Associated symptoms: no numbness   Risk factors: no frequent fractures     Past Medical History  Diagnosis Date  . Elevated hemoglobin A1c    Past Surgical History  Procedure Laterality Date  . Arthroscopy knee w/ drilling     No family history on file. Social History  Substance Use Topics  . Smoking status: None  . Smokeless tobacco: None  . Alcohol Use: No    Review of Systems  Musculoskeletal: Positive for back pain and arthralgias.  All other systems reviewed and are negative.     Allergies  Review of patient's allergies indicates no known allergies.  Home Medications   Prior to Admission medications   Medication Sig Start Date End Date Taking? Authorizing Provider  amLODipine-benazepril (LOTREL) 10-20 MG per capsule Take 1 capsule by mouth daily. 07/13/15   Merlyn Albert, MD  aspirin 81 MG tablet Take 81 mg by mouth daily.    Historical Provider, MD  etodolac (LODINE) 400 MG tablet Take 1 tablet (400 mg total) by mouth 2 (two) times daily. 03/01/15   Merlyn Albert, MD  hydrochlorothiazide (HYDRODIURIL) 25 MG tablet TAKE 1 TABLET (25 MG TOTAL) BY MOUTH DAILY 07/13/15   Merlyn Albert, MD   pravastatin (PRAVACHOL) 80 MG tablet TAKE 1 TABLET (80 MG TOTAL) BY MOUTH EVERY EVENING. 07/13/15   Merlyn Albert, MD  sildenafil (REVATIO) 20 MG tablet TAKE 2 & 1/2 TABLETS AS NEEDED Patient not taking: Reported on 07/13/2015 01/10/15   Merlyn Albert, MD   BP 128/81 mmHg  Pulse 78  Temp(Src) 97.6 F (36.4 C) (Oral)  Resp 15  Ht  (1.854 m)  Wt 200 lb (90.719 kg)  BMI 26.39 kg/m2  SpO2 98% Physical Exam  Constitutional: He is oriented to person, place, and time. He appears well-developed and well-nourished.  Non-toxic appearance.  HENT:  Head: Normocephalic.  Right Ear: Tympanic membrane and external ear normal.  Left Ear: Tympanic membrane and external ear normal.  Eyes: EOM and lids are normal. Pupils are equal, round, and reactive to light.  Neck: Normal range of motion. Neck supple. Carotid bruit is not present.  Cardiovascular: Normal rate, regular rhythm, normal heart sounds, intact distal pulses and normal pulses.   Pulmonary/Chest: Breath sounds normal. No respiratory distress.  Abdominal: Soft. Bowel sounds are normal. There is no tenderness. There is no guarding.  Musculoskeletal: Normal range of motion.  Pain of the left hip to the knee with walking.  Lymphadenopathy:       Head (right side): No submandibular adenopathy present.       Head (left side): No submandibular adenopathy present.  He has no cervical adenopathy.  Neurological: He is alert and oriented to person, place, and time. He has normal strength. No cranial nerve deficit or sensory deficit.  Pt ambulatory with pain and a limp. Left leg raise positive at 30 degrees.  Skin: Skin is warm and dry.  Psychiatric: He has a normal mood and affect. His speech is normal.  Nursing note and vitals reviewed.   ED Course Pt seen by Dr Adriana Simas. Out pt MRI ordered.  Procedures (including critical care time) Labs Review Labs Reviewed - No data to display  Imaging Review No results found. I have personally  reviewed and evaluated these images and lab results as part of my medical decision-making.   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  There is pelvic enthesopathy present on the hip/pelvis xray. Will obtain an out patient MRI of the l Spine. No gross neurovascular deficits. Plan - Pt to discuss his recent findings with the PCP to develop a management plan. Rx for Norco, valium, and decadron given to the patient. MRI scheduled with radiology.    Final diagnoses:  None    *I have reviewed nursing notes, vital signs, and all appropriate lab and imaging results for this patient.**    Ivery Quale, PA-C 08/02/15 1114  [address] PA-C 08/02/15 1116  Donnetta Hutching, MD 08/05/15 [phone]

## 2015-07-31 ENCOUNTER — Encounter: Payer: Self-pay | Admitting: Family Medicine

## 2015-07-31 ENCOUNTER — Ambulatory Visit (INDEPENDENT_AMBULATORY_CARE_PROVIDER_SITE_OTHER): Payer: Managed Care, Other (non HMO) | Admitting: Family Medicine

## 2015-07-31 VITALS — BP 132/70 | Ht 73.0 in | Wt 201.0 lb

## 2015-07-31 DIAGNOSIS — M5432 Sciatica, left side: Secondary | ICD-10-CM | POA: Diagnosis not present

## 2015-07-31 MED ORDER — HYDROCODONE-ACETAMINOPHEN 5-325 MG PO TABS: ORAL_TABLET | ORAL | Status: DC

## 2015-07-31 NOTE — Progress Notes (Signed)
   Subjective:    Patient ID: Brian Ewing, male    DOB: [birth year], 54 y.o.   MRN: [id number] Patient arrives office with spouse HPI Patient arrives for a follow up from ER for hip pain and sciatica.  Deep painful terrible pain. Pain is now day and night. Unable to work. Very severe. Feels numbness in his foot.  No change in urinary or bowel habits.  Having even difficulty walking.  Patient and wife once to know his options  Throbbing and aching   Patient states he is unable to work due to pain. Patient has MRI scheduled 08/10/15.   Review of Systems No headache no chest pain no back pain no abdominal pain    Objective:   Physical Exam Alert vitals stable lungs clear. Heart regular in rhythm. H&T normal. Positive left straight leg raise severe in nature. Deep tendon reflexes intact sensation currently intact strength intact       Assessment & Plan:  Impression sciatica progressivediscussed. Potential for MRI revealing etiology discussed potential interventions at that point discussed. Local measures and pain medication discussed and prescribed. Work excuse given many questions answered 25 minutes spent most in discussion with spouse and patient WSL

## 2015-08-03 DIAGNOSIS — Z029 Encounter for administrative examinations, unspecified: Secondary | ICD-10-CM

## 2015-08-09 ENCOUNTER — Encounter: Payer: Self-pay | Admitting: Family Medicine

## 2015-08-09 ENCOUNTER — Telehealth: Payer: Self-pay | Admitting: Family Medicine

## 2015-08-09 DIAGNOSIS — Z029 Encounter for administrative examinations, unspecified: Secondary | ICD-10-CM

## 2015-08-09 NOTE — Telephone Encounter (Signed)
Ov friday plus copy of their denial, that's ridiculous pt has true sciativca

## 2015-08-09 NOTE — Telephone Encounter (Signed)
Called & notified pt, appt scheduled.

## 2015-08-09 NOTE — Telephone Encounter (Signed)
ER doc ordered MRI, [employer] submitted prior auth request, it was denied.  Patient is calling us to see what we can do, if anything, to get it approved.  I tried to explain to the patient that if the request was denied that he probably does not meet the criteria set by his insurance company.  Please advise [employer] for further documentation or refer to maybe ortho?  (neurosurgery ususally requires MRI or CT results with referral)  Please advise

## 2015-08-10 ENCOUNTER — Ambulatory Visit (HOSPITAL_COMMUNITY): Payer: Managed Care, Other (non HMO)

## 2015-08-11 ENCOUNTER — Ambulatory Visit (INDEPENDENT_AMBULATORY_CARE_PROVIDER_SITE_OTHER): Payer: Managed Care, Other (non HMO) | Admitting: Family Medicine

## 2015-08-11 ENCOUNTER — Encounter: Payer: Self-pay | Admitting: Family Medicine

## 2015-08-11 VITALS — BP 126/88 | Ht 73.0 in | Wt 196.0 lb

## 2015-08-11 DIAGNOSIS — M5432 Sciatica, left side: Secondary | ICD-10-CM

## 2015-08-11 DIAGNOSIS — M549 Dorsalgia, unspecified: Secondary | ICD-10-CM | POA: Diagnosis not present

## 2015-08-11 MED ORDER — HYDROCODONE-ACETAMINOPHEN 5-325 MG PO TABS: ORAL_TABLET | ORAL | Status: DC

## 2015-08-11 NOTE — Progress Notes (Signed)
   Subjective:    Patient ID: Brian Ewing, male    DOB: 1961-08-19, 54 y.o.   MRN: [id number]  HPIFollow up on back pain. Pt states insurance would not approve MRI. Back pain. Went to ed on 9/18. Pain started about 6 months ago or more.   For the past month the pain has been severe. Patient is limping. Feels like he is week in his left leg. Pain extends from the low back all the way to the foot. Notes sensation numbness in the foot at times. Definitely needs his pain medication.  Pain is under control with the medicine  Not walking correctly, few stweps not bad but starts hurting ater that,  Pain med causing some drowiness        Review of Systems No headache no chest pain no upper back pain no abdominal pain no change in bowel habits no change in urinary habits    Objective:   Physical Exam  Alert vital stable lungs clear heart rare rhythm H&T normal left sciatic notch tenderness deep palpation no true spinal tenderness no sutures abdomen benign left leg deep tendon reflexes appear intact however dorsiflexion weakness noted a left great toe straight leg very positive with patient in extreme pain with extension of leg      Assessment & Plan:  Impression sciatica for several months and now severe for greater than one month's duration. Patient has clear signs of nerve compromise and even dorsiflexion weakness left great toe. Definitely needs an MRI and a urgent referral. Sadly his insurance company denied the first MRI requests through the emergency room and this cannot be tolerated. Patient at risk for developing permanent neurological compromise WSL

## 2015-08-17 ENCOUNTER — Telehealth: Payer: Self-pay | Admitting: Family Medicine

## 2015-08-17 DIAGNOSIS — M5430 Sciatica, unspecified side: Secondary | ICD-10-CM

## 2015-08-17 NOTE — Telephone Encounter (Signed)
Scan was at [facility]. Was report sent back to dr Brett Canales

## 2015-08-17 NOTE — Telephone Encounter (Signed)
Pt calling to check on the results of his MRI

## 2015-08-17 NOTE — Telephone Encounter (Signed)
Yes

## 2015-08-18 NOTE — Telephone Encounter (Signed)
Called patient and informed him per Dr.Steve Luking-Patient has multiple herniations, l 4 5 is worse pressing on his nerve, needs neurosurg ref, many or may not need surg, they may rec injections first not sure. Patient verbalized understanding. Referral put into epic for Neurosurgery.

## 2015-08-18 NOTE — Telephone Encounter (Signed)
Pt has multiple herniations, l 4 5 is worse pressing on his nerve, needs neurosurg ref, many or may not need surg, they may rec injections first not sure.

## 2015-08-24 LAB — HM DIABETES EYE EXAM

## 2015-08-28 ENCOUNTER — Other Ambulatory Visit: Payer: Self-pay | Admitting: Neurosurgery

## 2015-08-28 DIAGNOSIS — M48061 Spinal stenosis, lumbar region without neurogenic claudication: Secondary | ICD-10-CM

## 2015-08-29 ENCOUNTER — Ambulatory Visit
Admission: RE | Admit: 2015-08-29 | Discharge: 2015-08-29 | Disposition: A | Discharge status: Home / Self Care | Payer: Managed Care, Other (non HMO) | Source: Ambulatory Visit | Attending: Neurosurgery | Admitting: Neurosurgery

## 2015-08-29 DIAGNOSIS — M48061 Spinal stenosis, lumbar region without neurogenic claudication: Secondary | ICD-10-CM

## 2015-08-29 MED ORDER — IOHEXOL 180 MG/ML  SOLN
1.0000 mL | Freq: Once | INTRAMUSCULAR | Status: DC | PRN
  Administered 2015-08-29: 1 mL via EPIDURAL

## 2015-08-29 MED ORDER — METHYLPREDNISOLONE ACETATE 40 MG/ML INJ SUSP (RADIOLOG
120.0000 mg | Freq: Once | INTRAMUSCULAR | Status: AC
  Administered 2015-08-29: 120 mg via EPIDURAL

## 2015-08-29 NOTE — Discharge Instructions (Signed)

## 2015-09-12 ENCOUNTER — Encounter: Payer: Self-pay | Admitting: *Deleted

## 2015-09-28 ENCOUNTER — Other Ambulatory Visit: Payer: Self-pay | Admitting: Neurosurgery

## 2015-09-28 DIAGNOSIS — M48061 Spinal stenosis, lumbar region without neurogenic claudication: Secondary | ICD-10-CM

## 2015-10-09 ENCOUNTER — Ambulatory Visit
Admission: RE | Admit: 2015-10-09 | Discharge: 2015-10-09 | Disposition: A | Discharge status: Home / Self Care | Payer: Managed Care, Other (non HMO) | Source: Ambulatory Visit | Attending: Neurosurgery | Admitting: Neurosurgery

## 2015-10-09 DIAGNOSIS — M48061 Spinal stenosis, lumbar region without neurogenic claudication: Secondary | ICD-10-CM

## 2015-10-09 MED ORDER — IOHEXOL 180 MG/ML  SOLN
1.0000 mL | Freq: Once | INTRAMUSCULAR | Status: AC | PRN
  Administered 2015-10-09: 1 mL via EPIDURAL

## 2015-10-09 MED ORDER — METHYLPREDNISOLONE ACETATE 40 MG/ML INJ SUSP (RADIOLOG
120.0000 mg | Freq: Once | INTRAMUSCULAR | Status: AC
  Administered 2015-10-09: 120 mg via EPIDURAL

## 2015-10-09 NOTE — Discharge Instructions (Signed)

## 2015-11-16 ENCOUNTER — Encounter: Payer: Self-pay | Admitting: Family Medicine

## 2015-11-16 ENCOUNTER — Ambulatory Visit (INDEPENDENT_AMBULATORY_CARE_PROVIDER_SITE_OTHER): Payer: Managed Care, Other (non HMO) | Admitting: Family Medicine

## 2015-11-16 VITALS — BP 122/74 | Temp 99.3°F | Ht 73.0 in | Wt 208.0 lb

## 2015-11-16 DIAGNOSIS — B9689 Other specified bacterial agents as the cause of diseases classified elsewhere: Secondary | ICD-10-CM

## 2015-11-16 DIAGNOSIS — J019 Acute sinusitis, unspecified: Secondary | ICD-10-CM | POA: Diagnosis not present

## 2015-11-16 DIAGNOSIS — B338 Other specified viral diseases: Secondary | ICD-10-CM

## 2015-11-16 DIAGNOSIS — B348 Other viral infections of unspecified site: Secondary | ICD-10-CM

## 2015-11-16 MED ORDER — AZITHROMYCIN 250 MG PO TABS: ORAL_TABLET | ORAL | Status: DC

## 2015-11-16 NOTE — Progress Notes (Signed)
   Subjective:    Patient ID: Brian Ewing, male    DOB: [birth year], 55 y.o.   MRN: [id number]  Cough This is a new problem. Episode onset: 2 -3 days. Associated symptoms include a fever, headaches, nasal congestion and rhinorrhea. Pertinent negatives include no chest pain, ear pain or wheezing. He has tried nothing for the symptoms.    patient with significant head congestion drainage coughing in addition to this low-grade fever not feeling well energy level subpar.   Review of Systems  Constitutional: Positive for fever. Negative for activity change.  HENT: Positive for congestion and rhinorrhea. Negative for ear pain.   Eyes: Negative for discharge.  Respiratory: Positive for cough. Negative for wheezing.   Cardiovascular: Negative for chest pain.  Neurological: Positive for headaches.       Objective:   Physical Exam  Constitutional: He appears well-developed.  HENT:  Head: Normocephalic.  Mouth/Throat: Oropharynx is clear and moist. No oropharyngeal exudate.  Neck: Normal range of motion.  Cardiovascular: Normal rate, regular rhythm and normal heart sounds.   No murmur heard. Pulmonary/Chest: Effort normal and breath sounds normal. He has no wheezes.  Lymphadenopathy:    He has no cervical adenopathy.  Neurological: He exhibits normal muscle tone.  Skin: Skin is warm and dry.  Nursing note and vitals reviewed.     Patient not respiratory distress not toxic  I believe the patient probably gathered a parainfluenza-like illness that triggered sinus infection I do not hear any signs of pneumonia    Assessment & Plan:   viral syndrome Secondary rhinosinusitis Antibiotics prescribed Follow-up if problems Patient was seen today for upper respiratory illness. It is felt that the patient is dealing with sinusitis. Antibiotics were prescribed today. Importance of compliance with medication was discussed. Symptoms should gradually resolve over the course of the next several  days. If high fevers, progressive illness, difficulty breathing, worsening condition or failure for symptoms to improve over the next several days then the patient is to follow-up. If any emergent conditions the patient is to follow-up in the emergency department otherwise to follow-up in the office.

## 2015-12-01 ENCOUNTER — Telehealth: Payer: Self-pay | Admitting: Family Medicine

## 2015-12-01 DIAGNOSIS — Z79899 Other long term (current) drug therapy: Secondary | ICD-10-CM

## 2015-12-01 DIAGNOSIS — Z125 Encounter for screening for malignant neoplasm of prostate: Secondary | ICD-10-CM

## 2015-12-01 DIAGNOSIS — E785 Hyperlipidemia, unspecified: Secondary | ICD-10-CM

## 2015-12-01 DIAGNOSIS — E119 Type 2 diabetes mellitus without complications: Secondary | ICD-10-CM

## 2015-12-01 NOTE — Telephone Encounter (Signed)
Pt is requesting lab orders to be sent over for an upcoming wellness visit. Last labs per epic were: lipid,hepatic and poct on 07/13/15

## 2015-12-05 NOTE — Telephone Encounter (Signed)
Lipid liver met 7 A1c micro-proteinuria analysis PSA

## 2015-12-05 NOTE — Telephone Encounter (Signed)
Orders ready. Pt notified.  

## 2016-01-06 LAB — HEPATIC FUNCTION PANEL
ALT: 30 IU/L (ref 0–44)
AST: 27 IU/L (ref 0–40)
Albumin: 4.5 g/dL (ref 3.5–5.5)
Alkaline Phosphatase: 46 IU/L (ref 39–117)
BILIRUBIN TOTAL: 0.6 mg/dL (ref 0.0–1.2)
BILIRUBIN, DIRECT: 0.19 mg/dL (ref 0.00–0.40)
Total Protein: 6.9 g/dL (ref 6.0–8.5)

## 2016-01-06 LAB — BASIC METABOLIC PANEL
BUN / CREAT RATIO: 15 (ref 9–20)
BUN: 17 mg/dL (ref 6–24)
CHLORIDE: 98 mmol/L (ref 96–106)
CO2: 26 mmol/L (ref 18–29)
Calcium: 9.6 mg/dL (ref 8.7–10.2)
Creatinine, Ser: 1.15 mg/dL (ref 0.76–1.27)
GFR, EST AFRICAN AMERICAN: 82 mL/min/{1.73_m2} (ref >59)
GFR, EST NON AFRICAN AMERICAN: 71 mL/min/{1.73_m2} (ref >59)
Glucose: 84 mg/dL (ref 65–99)
POTASSIUM: 4 mmol/L (ref 3.5–5.2)
SODIUM: 142 mmol/L (ref 134–144)

## 2016-01-06 LAB — MICROALBUMIN / CREATININE URINE RATIO
Creatinine, Urine: 234.1 mg/dL
MICROALB/CREAT RATIO: 3.8 mg/g{creat} (ref 0.0–30.0)
Microalbumin, Urine: 9 ug/mL

## 2016-01-06 LAB — PSA: Prostate Specific Ag, Serum: 0.3 ng/mL (ref 0.0–4.0)

## 2016-01-06 LAB — LIPID PANEL
CHOL/HDL RATIO: 2.5 ratio (ref 0.0–5.0)
CHOLESTEROL TOTAL: 127 mg/dL (ref 100–199)
HDL: 51 mg/dL (ref >39)
LDL Calculated: 71 mg/dL (ref 0–99)
TRIGLYCERIDES: 27 mg/dL (ref 0–149)
VLDL Cholesterol Cal: 5 mg/dL (ref 5–40)

## 2016-01-06 LAB — HEMOGLOBIN A1C
Est. average glucose Bld gHb Est-mCnc: 114 mg/dL
Hgb A1c MFr Bld: 5.6 % (ref 4.8–5.6)

## 2016-01-10 ENCOUNTER — Encounter: Payer: Managed Care, Other (non HMO) | Admitting: Family Medicine

## 2016-01-12 ENCOUNTER — Ambulatory Visit (INDEPENDENT_AMBULATORY_CARE_PROVIDER_SITE_OTHER): Payer: Managed Care, Other (non HMO) | Admitting: Family Medicine

## 2016-01-12 ENCOUNTER — Encounter: Payer: Self-pay | Admitting: Family Medicine

## 2016-01-12 VITALS — BP 114/70 | Temp 98.9°F | Ht 72.0 in | Wt 204.0 lb

## 2016-01-12 DIAGNOSIS — E119 Type 2 diabetes mellitus without complications: Secondary | ICD-10-CM | POA: Diagnosis not present

## 2016-01-12 DIAGNOSIS — I10 Essential (primary) hypertension: Secondary | ICD-10-CM | POA: Diagnosis not present

## 2016-01-12 DIAGNOSIS — Z0000 Encounter for general adult medical examination without abnormal findings: Secondary | ICD-10-CM | POA: Diagnosis not present

## 2016-01-12 MED ORDER — SILDENAFIL CITRATE 20 MG PO TABS: ORAL_TABLET | ORAL | Status: DC

## 2016-01-12 MED ORDER — PRAVASTATIN SODIUM 80 MG PO TABS: ORAL_TABLET | ORAL | Status: DC

## 2016-01-12 MED ORDER — AMLODIPINE BESY-BENAZEPRIL HCL 10-20 MG PO CAPS: 1.0000 | ORAL_CAPSULE | Freq: Every day | ORAL | Status: DC

## 2016-01-12 MED ORDER — HYDROCHLOROTHIAZIDE 25 MG PO TABS: ORAL_TABLET | ORAL | Status: DC

## 2016-01-12 NOTE — Progress Notes (Signed)
Subjective:    Patient ID: Brian Ewing, male    DOB: 1961/10/23, 55 y.o.   MRN: [id number]  HPI The patient comes in today for a wellness visit.  Results for orders placed or performed in visit on 12/01/15  Lipid panel  Result Value Ref Range   Cholesterol, Total 127 100 - 199 mg/dL   Triglycerides 27 0 - 149 mg/dL   HDL 51 >40 mg/dL   VLDL Cholesterol Cal 5 5 - 40 mg/dL   LDL Calculated 71 0 - 99 mg/dL   Chol/HDL Ratio 2.5 0.0 - 5.0 ratio units  Hepatic function panel  Result Value Ref Range   Total Protein 6.9 6.0 - 8.5 g/dL   Albumin 4.5 3.5 - 5.5 g/dL   Bilirubin Total 0.6 0.0 - 1.2 mg/dL   Bilirubin, Direct 9.81 0.00 - 0.40 mg/dL   Alkaline Phosphatase 46 39 - 117 IU/L   AST 27 0 - 40 IU/L   ALT 30 0 - 44 IU/L  Basic metabolic panel  Result Value Ref Range   Glucose 84 65 - 99 mg/dL   BUN 17 6 - 24 mg/dL   Creatinine, Ser 1.91 0.76 - 1.27 mg/dL   GFR calc non Af Amer 71 >59 mL/min/1.73   GFR calc Af Amer 82 >59 mL/min/1.73   BUN/Creatinine Ratio 15 9 - 20   Sodium 142 134 - 144 mmol/L   Potassium 4.0 3.5 - 5.2 mmol/L   Chloride 98 96 - 106 mmol/L   CO2 26 18 - 29 mmol/L   Calcium 9.6 8.7 - 10.2 mg/dL  Hemoglobin Y7W  Result Value Ref Range   Hgb A1c MFr Bld 5.6 4.8 - 5.6 %   Est. average glucose Bld gHb Est-mCnc 114 mg/dL  PSA  Result Value Ref Range   Prostate Specific Ag, Serum 0.3 0.0 - 4.0 ng/mL  Microalbumin / creatinine urine ratio  Result Value Ref Range   Creatinine, Urine 234.1 Not Estab. mg/dL   Microalbum.,U,Random 9.0 Not Estab. ug/mL   MICROALB/CREAT RATIO 3.8 0.0 - 30.0 mg/g creat     A review of their health history was completed.  A review of medications was also completed.  Any needed refills; sildenafil  Eating habits: health conscious  Falls/  MVA accidents in past few months: none  Regular exercise: none  Specialist pt sees on regular basis: none  Preventative health issues were discussed.   Additional concerns: left  great toe pain.pain at the distal toe, worse with pressure, going on a week or two , worse with pressure    Started 1 week ago.  Cough for one week. Low grade fever. Body aches and weakness and muscle aches, had a cold sweat and felt bad and achey Taking mucinex. Pt states he was weak and felt like he was going to pass out.      Review of Systems  Constitutional: Negative for fever, activity change and appetite change.  HENT: Negative for congestion and rhinorrhea.   Eyes: Negative for discharge.  Respiratory: Negative for cough and wheezing.   Cardiovascular: Negative for chest pain.  Gastrointestinal: Negative for vomiting, abdominal pain and blood in stool.  Genitourinary: Negative for frequency and difficulty urinating.  Musculoskeletal: Negative for neck pain.  Skin: Negative for rash.  Allergic/Immunologic: Negative for environmental allergies and food allergies.  Neurological: Negative for weakness and headaches.  Psychiatric/Behavioral: Negative for agitation.  All other systems reviewed and are negative.      Objective:  Physical Exam  Constitutional: He appears well-developed and well-nourished.  HENT:  Head: Normocephalic and atraumatic.  Right Ear: External ear normal.  Left Ear: External ear normal.  Nose: Nose normal.  Mouth/Throat: Oropharynx is clear and moist.  Eyes: EOM are normal. Pupils are equal, round, and reactive to light.  Neck: Normal range of motion. Neck supple. No thyromegaly present.  Cardiovascular: Normal rate, regular rhythm and normal heart sounds.   No murmur heard. Pulmonary/Chest: Effort normal and breath sounds normal. No respiratory distress. He has no wheezes.  Abdominal: Soft. Bowel sounds are normal. He exhibits no distension and no mass. There is no tenderness.  Genitourinary: Penis normal.  Musculoskeletal: Normal range of motion. He exhibits no edema.  Lymphadenopathy:    He has no cervical adenopathy.  Neurological: He is  alert. He exhibits normal muscle tone.  Skin: Skin is warm and dry. No erythema.  Psychiatric: He has a normal mood and affect. His behavior is normal. Judgment normal.  Vitals reviewed.  hypertrophic left toenail no evidence of infection good pulses sensation        Assessment & Plan:  Impression 1 wellness exam #2 type 2 diabetes good control #3 hypertension good control medications reviewed today. #3 hyperlipidemia lab work reviewed meds reviewed to maintain same #4 onychomycosis with secondary thickening and pain #35 status post flu plan encouraged to see a podiatrist. Hemoccult cards. Maintain exact same meds. Diet exercise discussed. Recheck in 6 months

## 2016-01-17 ENCOUNTER — Ambulatory Visit: Payer: Managed Care, Other (non HMO) | Admitting: Podiatry

## 2016-01-18 ENCOUNTER — Encounter: Payer: Self-pay | Admitting: Podiatry

## 2016-01-18 ENCOUNTER — Ambulatory Visit (INDEPENDENT_AMBULATORY_CARE_PROVIDER_SITE_OTHER): Payer: Managed Care, Other (non HMO)

## 2016-01-18 ENCOUNTER — Ambulatory Visit (INDEPENDENT_AMBULATORY_CARE_PROVIDER_SITE_OTHER): Payer: Managed Care, Other (non HMO) | Admitting: Podiatry

## 2016-01-18 VITALS — BP 136/86 | HR 78 | Resp 12

## 2016-01-18 DIAGNOSIS — E119 Type 2 diabetes mellitus without complications: Secondary | ICD-10-CM

## 2016-01-18 DIAGNOSIS — M2040 Other hammer toe(s) (acquired), unspecified foot: Secondary | ICD-10-CM

## 2016-01-18 DIAGNOSIS — L600 Ingrowing nail: Secondary | ICD-10-CM

## 2016-01-18 DIAGNOSIS — Z0189 Encounter for other specified special examinations: Secondary | ICD-10-CM

## 2016-01-18 MED ORDER — NEOMYCIN-POLYMYXIN-HC 3.5-10000-1 OT SOLN
OTIC | Status: DC
Start: 2016-01-18 — End: 2017-04-16

## 2016-01-18 NOTE — Progress Notes (Signed)
   Subjective:    Patient ID: Brian Ewing, male    DOB: August 20, 1961, 55 y.o.   MRN: [id number]  HPI: He presents today with a chief complaint of history of diabetes with no complications and a painful hallux nail left. He states it is been like this for over a year and he seems to be getting worse.      Review of Systems  Skin: Positive for color change.       Objective:   Physical Exam: Vital signs are stable alert and oriented 3 pulses are palpable bilateral. Neurologic sensorium is intact. Deep tendon reflexes are intact. Capillary fill time is immediate. Orthopedic evaluation was resolved with systems of ankle range of motion without crepitation. Hammertoe deformities present. Cutaneous evaluation demonstrates ingrown nail hallux left.          Assessment & Plan:  Diabetes mellitus without complication hammertoe deformities ingrown nail hallux left.  Plan: Chemical matrixectomy of the hallux left was performed today after local anesthesia was administered. He tolerated procedure well without complications. I will follow-up with him in 1 week.

## 2016-01-18 NOTE — Patient Instructions (Signed)

## 2016-01-24 ENCOUNTER — Other Ambulatory Visit: Payer: Self-pay | Admitting: *Deleted

## 2016-01-24 DIAGNOSIS — Z0000 Encounter for general adult medical examination without abnormal findings: Secondary | ICD-10-CM

## 2016-01-24 LAB — POC HEMOCCULT BLD/STL (HOME/3-CARD/SCREEN)
Card #2 Fecal Occult Blod, POC: NEGATIVE
FECAL OCCULT BLD: NEGATIVE
FECAL OCCULT BLD: NEGATIVE

## 2016-01-25 ENCOUNTER — Ambulatory Visit (INDEPENDENT_AMBULATORY_CARE_PROVIDER_SITE_OTHER): Payer: Managed Care, Other (non HMO) | Admitting: Podiatry

## 2016-01-25 ENCOUNTER — Encounter: Payer: Self-pay | Admitting: Podiatry

## 2016-01-25 VITALS — BP 121/77 | HR 62 | Resp 12

## 2016-01-25 DIAGNOSIS — E119 Type 2 diabetes mellitus without complications: Secondary | ICD-10-CM

## 2016-01-25 DIAGNOSIS — L600 Ingrowing nail: Secondary | ICD-10-CM

## 2016-01-25 NOTE — Progress Notes (Signed)
Presents today 1 week status post matrixectomy tibial border hallux left. States that he seems to be doing pretty well. Continues to soak in Betadine water.  Objective: Vital signs stable alert and oriented 3. Pulses are palpable. No erythema edema saline as drainage or odor.  Assessment: Well-healing matrixectomy hallux left.  Plan: Continue Cortisporin otic. Discontinue Betadine in warm water start with S insults and warm water. Covered in the daily note bedtime. Continue to soak until completely resolved noted finally into weeks if not better. Notify me earlier should symptoms worsen or signs of infection arise.

## 2016-02-12 ENCOUNTER — Other Ambulatory Visit: Payer: Self-pay | Admitting: Neurosurgery

## 2016-02-12 DIAGNOSIS — M48061 Spinal stenosis, lumbar region without neurogenic claudication: Secondary | ICD-10-CM

## 2016-02-22 ENCOUNTER — Ambulatory Visit
Admission: RE | Admit: 2016-02-22 | Discharge: 2016-02-22 | Disposition: A | Discharge status: Home / Self Care | Payer: Managed Care, Other (non HMO) | Source: Ambulatory Visit | Attending: Neurosurgery | Admitting: Neurosurgery

## 2016-02-22 DIAGNOSIS — M48061 Spinal stenosis, lumbar region without neurogenic claudication: Secondary | ICD-10-CM

## 2016-02-22 MED ORDER — IOHEXOL 180 MG/ML  SOLN
1.0000 mL | Freq: Once | INTRAMUSCULAR | Status: AC | PRN
  Administered 2016-02-22: 1 mL via EPIDURAL

## 2016-02-22 MED ORDER — METHYLPREDNISOLONE ACETATE 40 MG/ML INJ SUSP (RADIOLOG
120.0000 mg | Freq: Once | INTRAMUSCULAR | Status: AC
  Administered 2016-02-22: 120 mg via EPIDURAL

## 2016-02-22 NOTE — Discharge Instructions (Signed)

## 2016-07-09 ENCOUNTER — Telehealth: Payer: Self-pay | Admitting: Family Medicine

## 2016-07-09 DIAGNOSIS — E119 Type 2 diabetes mellitus without complications: Secondary | ICD-10-CM

## 2016-07-09 DIAGNOSIS — E785 Hyperlipidemia, unspecified: Secondary | ICD-10-CM

## 2016-07-09 DIAGNOSIS — Z79899 Other long term (current) drug therapy: Secondary | ICD-10-CM

## 2016-07-09 NOTE — Telephone Encounter (Signed)
Patient wants to know if he needs BW ordered for his appointment on 07/16/16?

## 2016-07-09 NOTE — Telephone Encounter (Signed)
Patient notified blood work has been ordered.

## 2016-07-09 NOTE — Telephone Encounter (Signed)
Lip liv A1c 

## 2016-07-10 LAB — HEMOGLOBIN A1C
ESTIMATED AVERAGE GLUCOSE: 114 mg/dL
Hgb A1c MFr Bld: 5.6 % (ref 4.8–5.6)

## 2016-07-10 LAB — LIPID PANEL
CHOLESTEROL TOTAL: 136 mg/dL (ref 100–199)
Chol/HDL Ratio: 2.3 ratio units (ref 0.0–5.0)
HDL: 59 mg/dL (ref >39)
LDL CALC: 70 mg/dL (ref 0–99)
TRIGLYCERIDES: 33 mg/dL (ref 0–149)
VLDL Cholesterol Cal: 7 mg/dL (ref 5–40)

## 2016-07-10 LAB — HEPATIC FUNCTION PANEL
ALBUMIN: 4.7 g/dL (ref 3.5–5.5)
ALT: 30 IU/L (ref 0–44)
AST: 30 IU/L (ref 0–40)
Alkaline Phosphatase: 47 IU/L (ref 39–117)
BILIRUBIN TOTAL: 0.8 mg/dL (ref 0.0–1.2)
Bilirubin, Direct: 0.23 mg/dL (ref 0.00–0.40)
Total Protein: 7.4 g/dL (ref 6.0–8.5)

## 2016-07-16 ENCOUNTER — Encounter: Payer: Self-pay | Admitting: Family Medicine

## 2016-07-16 ENCOUNTER — Ambulatory Visit (INDEPENDENT_AMBULATORY_CARE_PROVIDER_SITE_OTHER): Payer: Managed Care, Other (non HMO) | Admitting: Family Medicine

## 2016-07-16 VITALS — BP 132/82 | Ht 72.0 in | Wt 200.8 lb

## 2016-07-16 DIAGNOSIS — E119 Type 2 diabetes mellitus without complications: Secondary | ICD-10-CM | POA: Diagnosis not present

## 2016-07-16 DIAGNOSIS — Z23 Encounter for immunization: Secondary | ICD-10-CM | POA: Diagnosis not present

## 2016-07-16 DIAGNOSIS — E785 Hyperlipidemia, unspecified: Secondary | ICD-10-CM

## 2016-07-16 DIAGNOSIS — I10 Essential (primary) hypertension: Secondary | ICD-10-CM

## 2016-07-16 MED ORDER — PRAVASTATIN SODIUM 80 MG PO TABS: ORAL_TABLET | ORAL | 5 refills | Status: DC

## 2016-07-16 MED ORDER — AMLODIPINE BESY-BENAZEPRIL HCL 10-20 MG PO CAPS: 1.0000 | ORAL_CAPSULE | Freq: Every day | ORAL | 5 refills | Status: DC

## 2016-07-16 MED ORDER — HYDROCHLOROTHIAZIDE 25 MG PO TABS: ORAL_TABLET | ORAL | 5 refills | Status: DC

## 2016-07-16 NOTE — Progress Notes (Signed)
   Subjective:    Patient ID: Brian Ewing, male    DOB: 1961-08-18, 55 y.o.   MRN: [id number]  Hypertension  This is a chronic problem. The current episode started more than 1 year ago. Risk factors for coronary artery disease include dyslipidemia and male gender. Treatments tried: lotrel, hctz. There are no compliance problems.    Results for orders placed or performed in visit on 07/09/16  Lipid panel  Result Value Ref Range   Cholesterol, Total 136 100 - 199 mg/dL   Triglycerides 33 0 - 149 mg/dL   HDL 59 >78>39 mg/dL   VLDL Cholesterol Cal 7 5 - 40 mg/dL   LDL Calculated 70 0 - 99 mg/dL   Chol/HDL Ratio 2.3 0.0 - 5.0 ratio units  Hepatic function panel  Result Value Ref Range   Total Protein 7.4 6.0 - 8.5 g/dL   Albumin 4.7 3.5 - 5.5 g/dL   Bilirubin Total 0.8 0.0 - 1.2 mg/dL   Bilirubin, Direct 2.950.23 0.00 - 0.40 mg/dL   Alkaline Phosphatase 47 39 - 117 IU/L   AST 30 0 - 40 IU/L   ALT 30 0 - 44 IU/L  Hemoglobin A1c  Result Value Ref Range   Hgb A1c MFr Bld 5.6 4.8 - 5.6 %   Est. average glucose Bld gHb Est-mCnc 114 mg/dL   Patient claims compliance with diabetes medication. No obvious side effects. Reports no substantial low sugar spells. Most numbers are generally in good range when checked fasting. Generally does not miss a dose of medication. Watching diabetic diet closely Most morn numbers between 80s and 90s  Patient continues to take lipid medication regularly. No obvious side effects from it. Generally does not miss a dose. Prior blood work results are reviewed with patient. Patient continues to work on fat intake in diet   Blood pressure medicine and blood pressure levels reviewed today with patient. Compliant with blood pressure medicine. States does not miss a dose. No obvious side effects. Blood pressure generally good when checked elsewhere. Watching salt intake.   Discuss lab work  Review of Systems No headache, no major weight loss or weight gain, no chest  pain no back pain abdominal pain no change in bowel habits complete ROS otherwise negative     Objective:   Physical Exam  Alert vitals stable, NAD. Blood pressure good on repeat. HEENT normal. Lungs clear. Heart regular rate and rhythm.       Assessment & Plan:  Impression 1 hypertension discussed good control maintain same dose #2 hyperlipidemia discuss good control maintain same #3 type 2 diabetes excellent control maintain same approach diet exercise discussed.

## 2016-10-20 IMAGING — DX DG HIP (WITH OR WITHOUT PELVIS) 2-3V*L*
3 series · 3 of 3 positions shown · non-contrast
Comparison: None.

CLINICAL DATA: Lower back pain with pain radiating down left hip

EXAM:
DG HIP (WITH OR WITHOUT PELVIS) 2-3V LEFT

[pelvis ap]
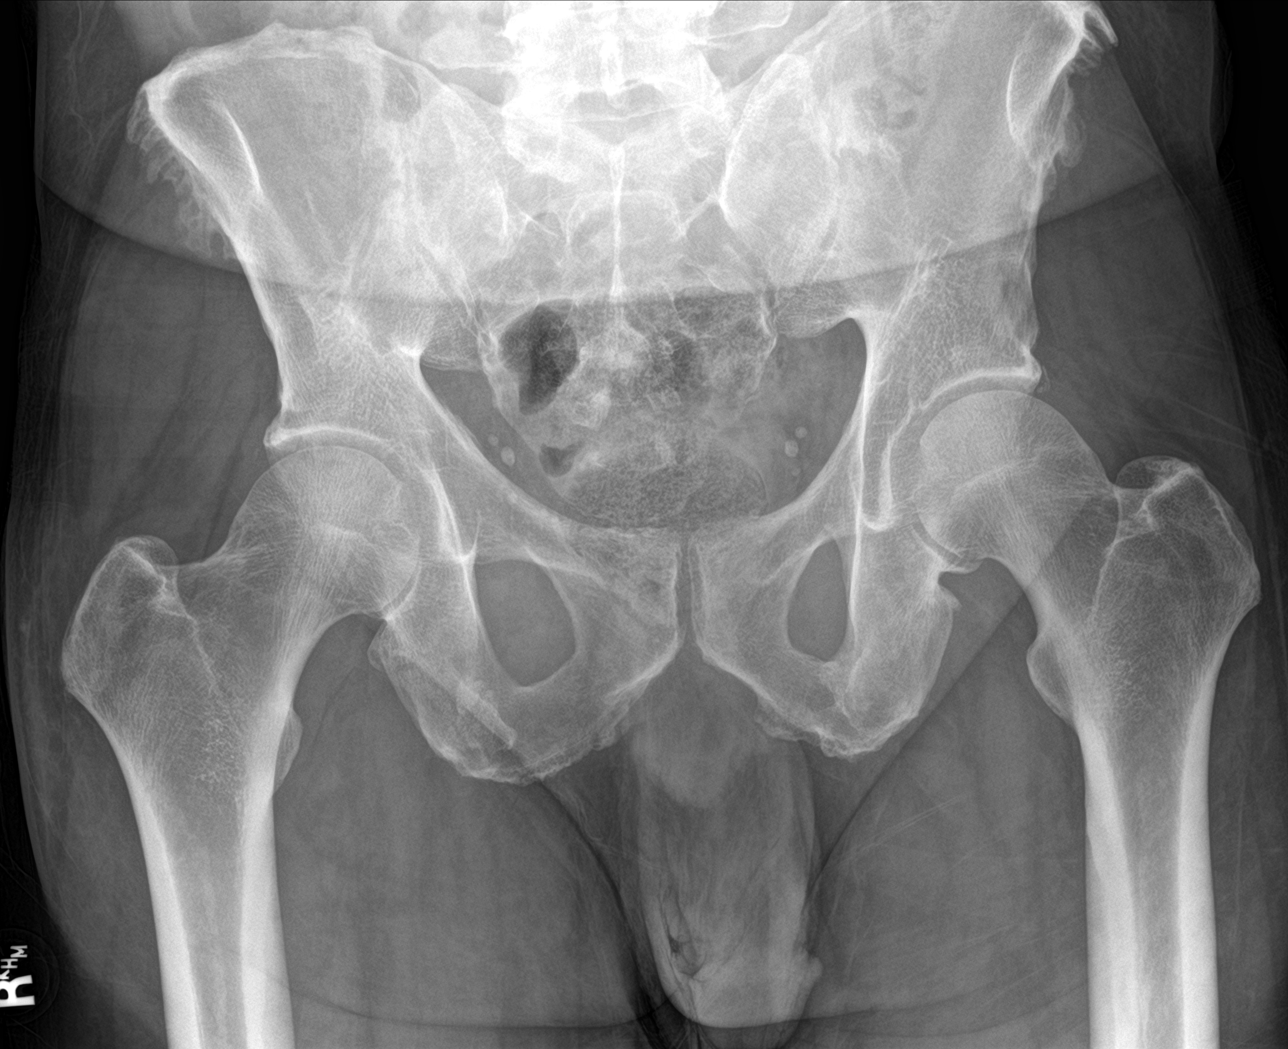

[hip ap]
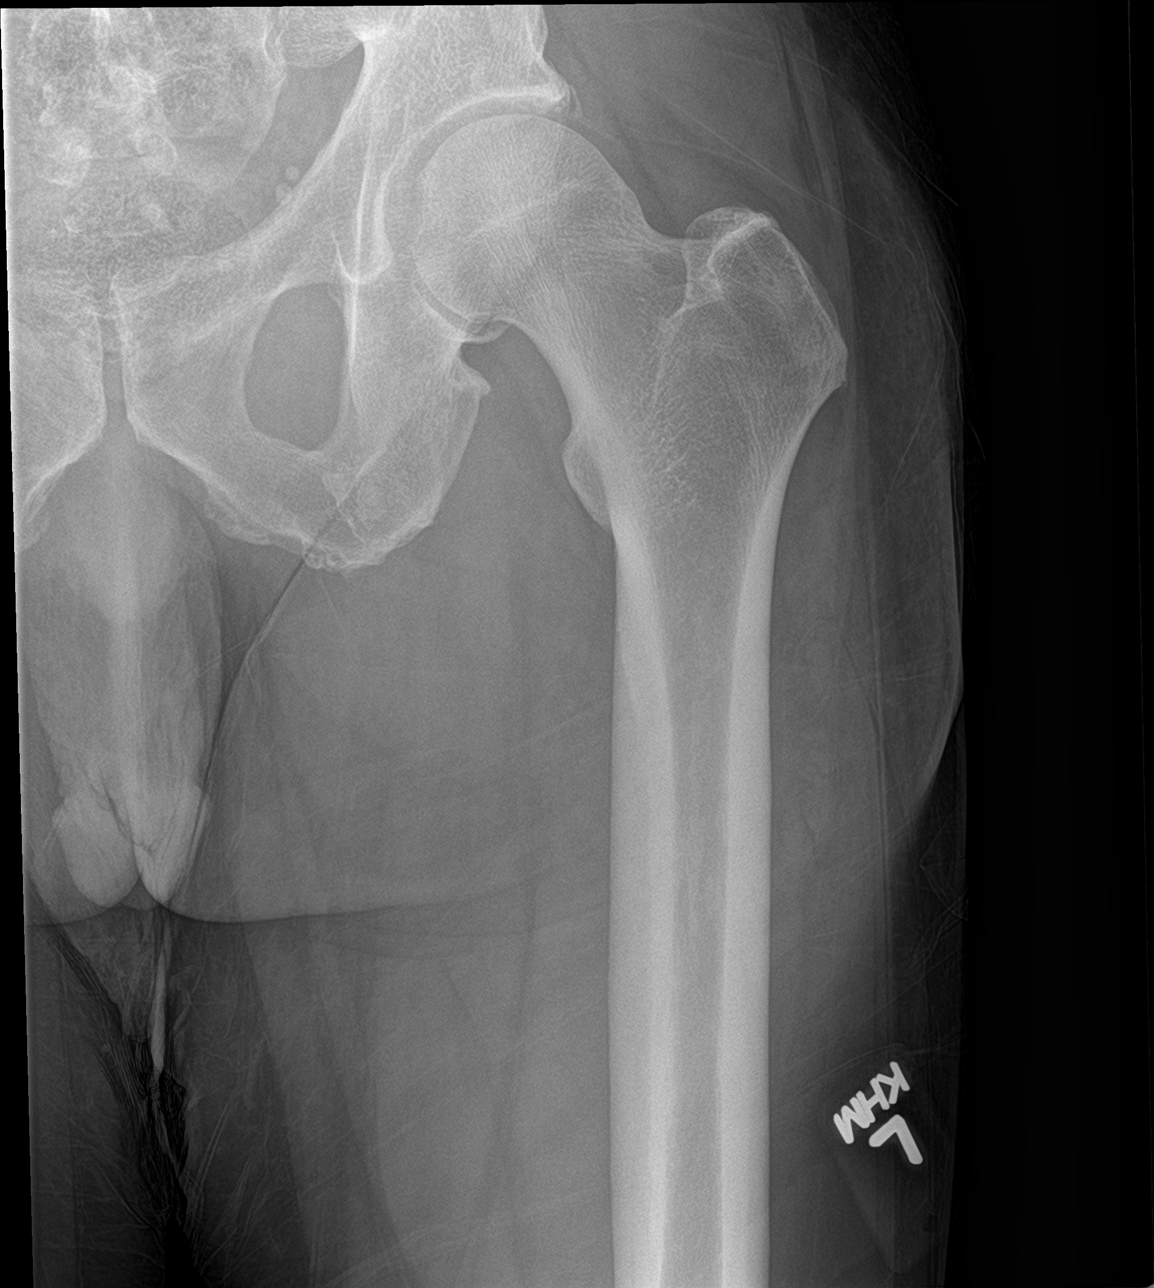

[hip lat]
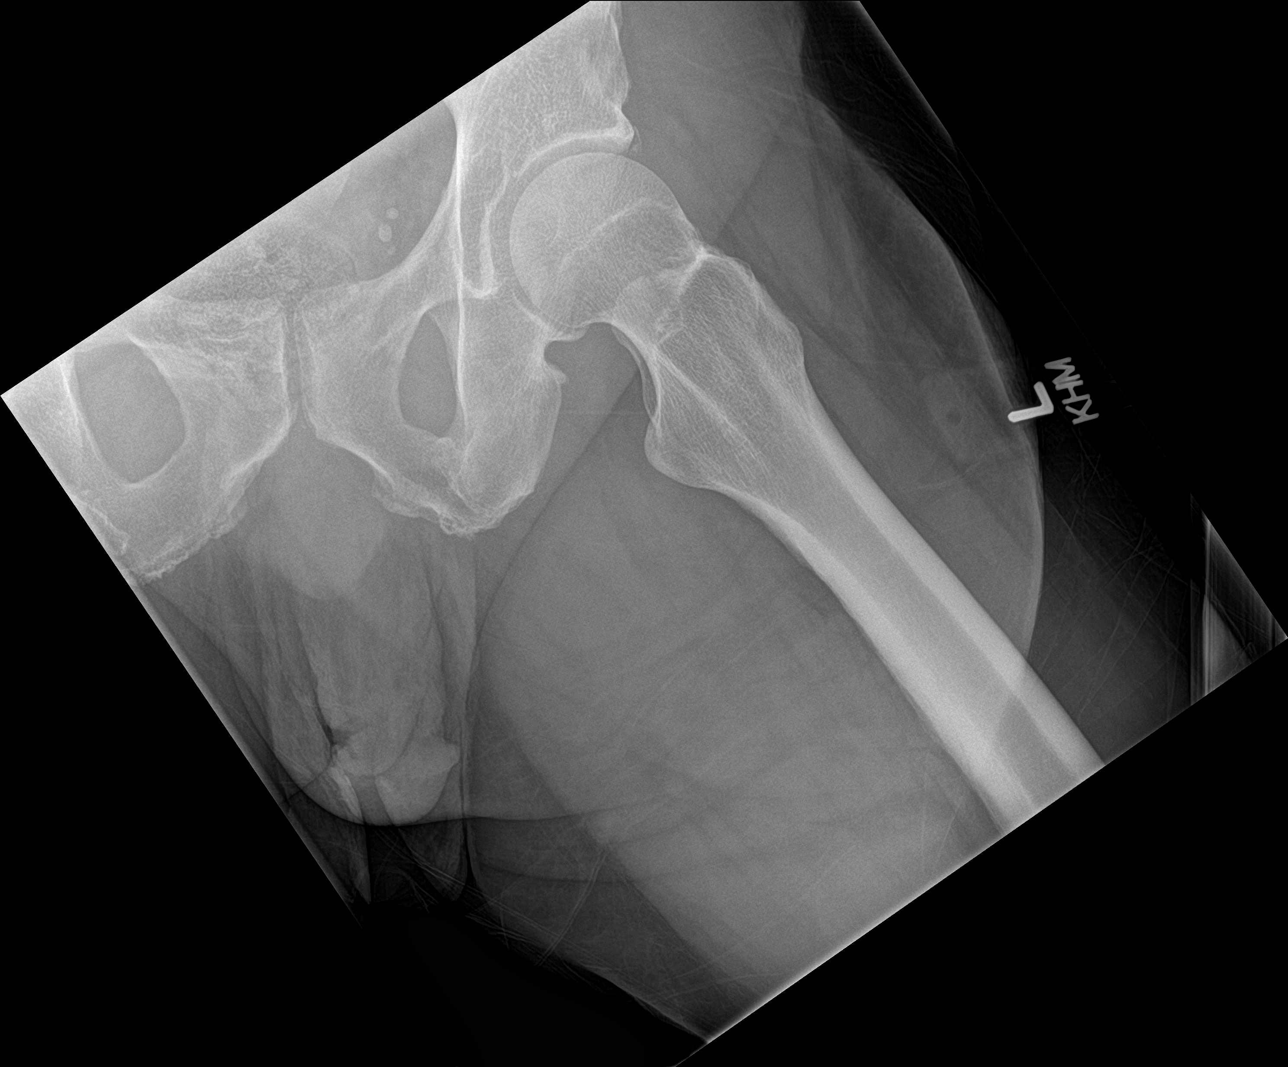

[3 of 3 positions shown; findings below may reference images not displayed]

FINDINGS: There is no evidence of hip fracture or dislocation. There is no
evidence of arthropathy or other focal bone abnormality. Moderate
pelvic enthesopathy. Bilateral pelvic phleboliths.
IMPRESSION: No acute abnormality.

## 2016-10-20 IMAGING — DX DG LUMBAR SPINE COMPLETE 4+V
5 series · 5 of 5 positions shown · non-contrast
Comparison: None.

CLINICAL DATA: Lower back pain with pain radiating down left hip
Lower back pain with pain radiating down left hip

EXAM:
LUMBAR SPINE - COMPLETE 4+ VIEW

[l-spine ap]
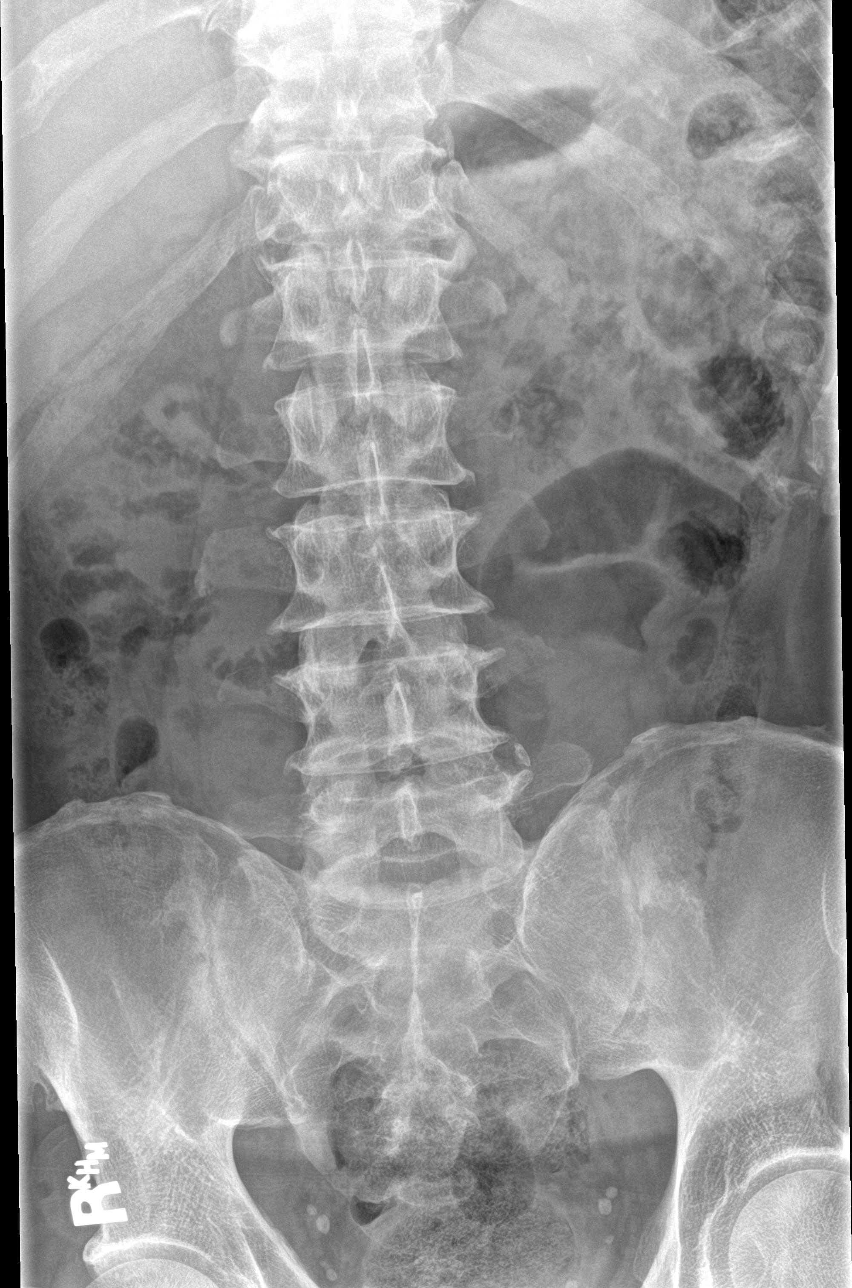

[l-spine obl (1 of 2)]
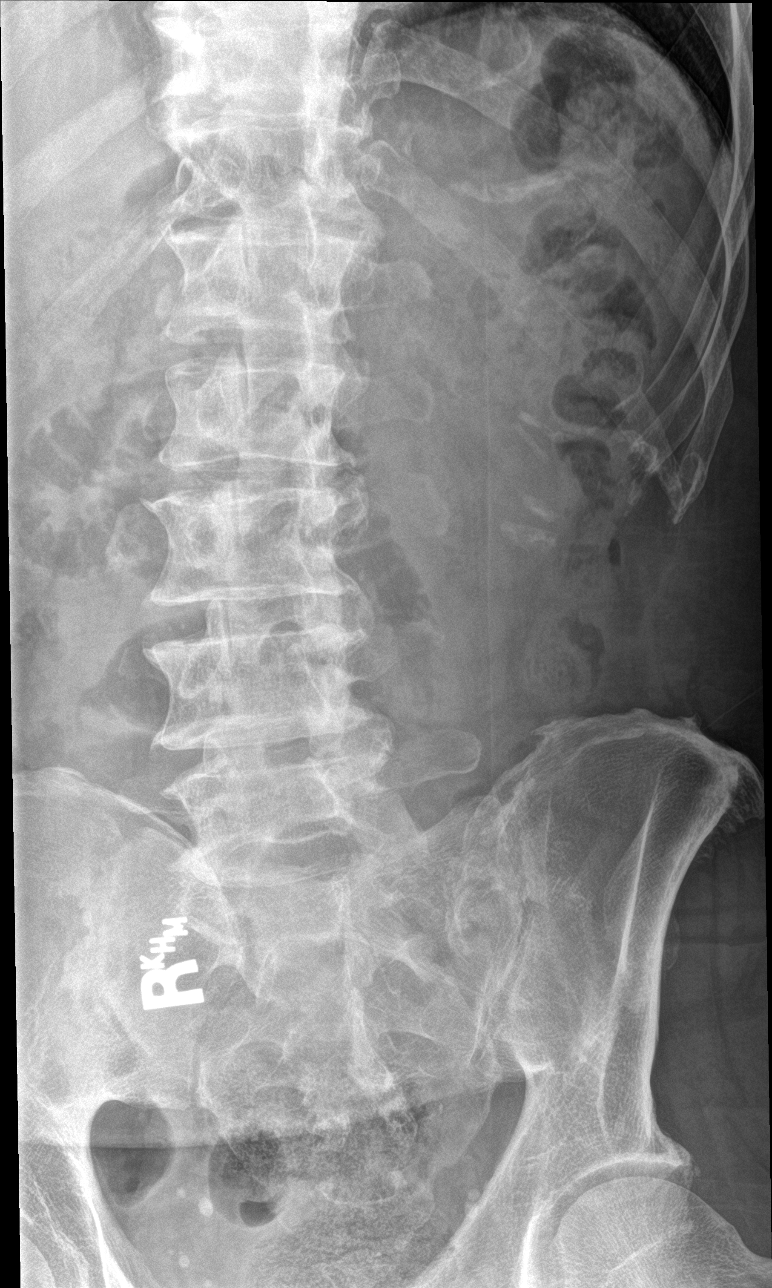

[l-spine obl (2 of 2)]
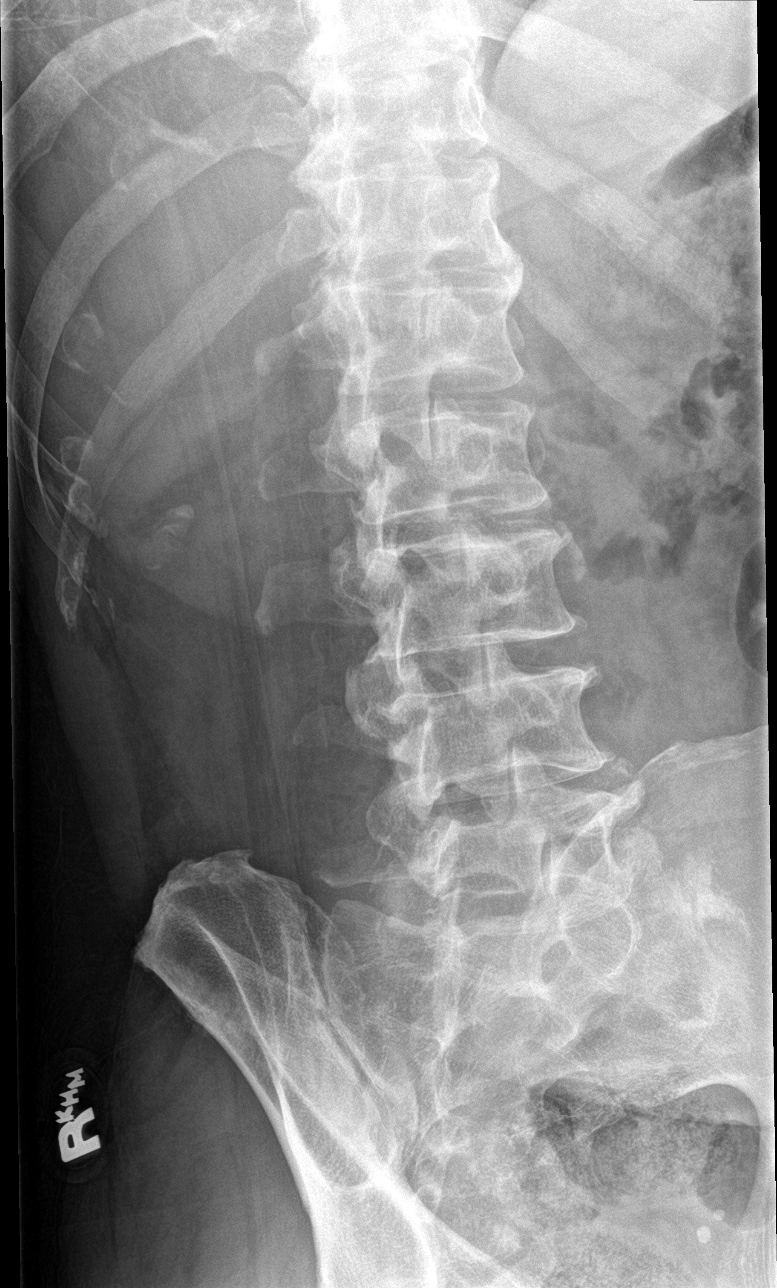

[l-spine lat]
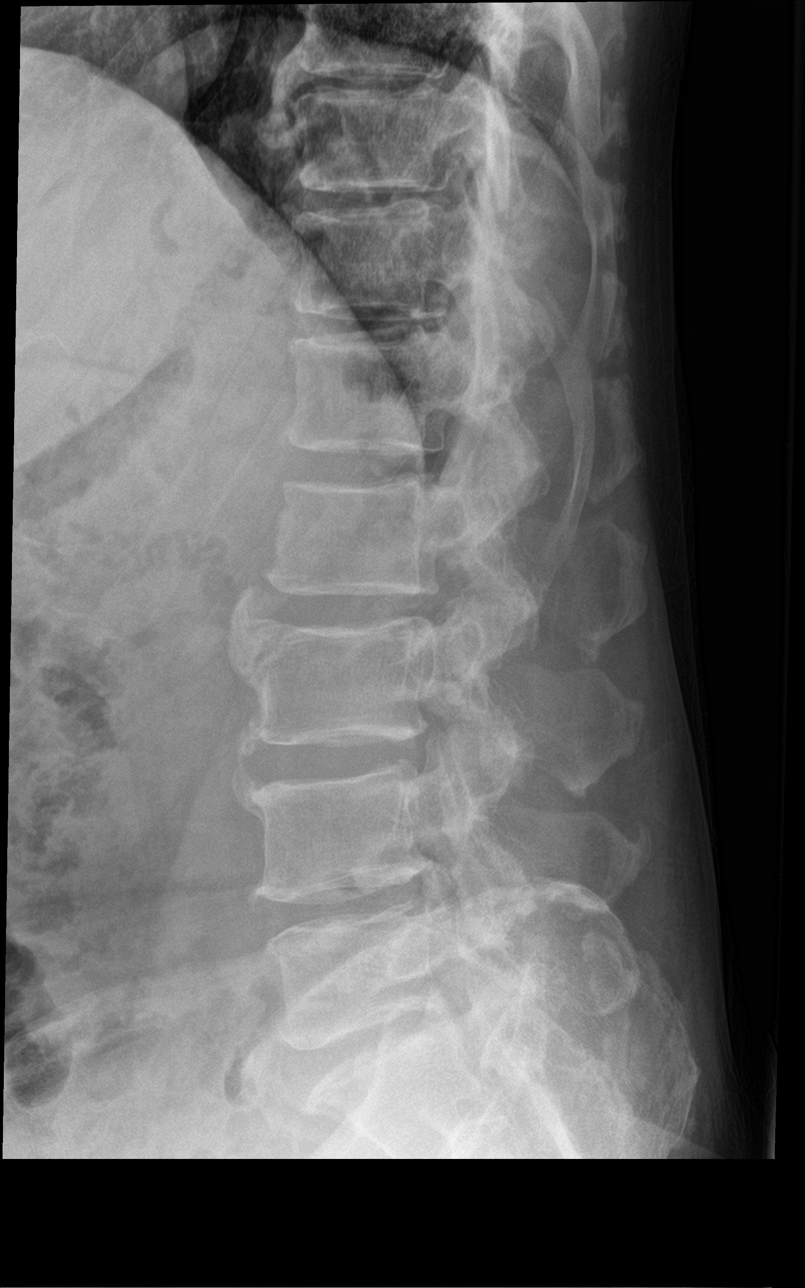

[l-spine spot]
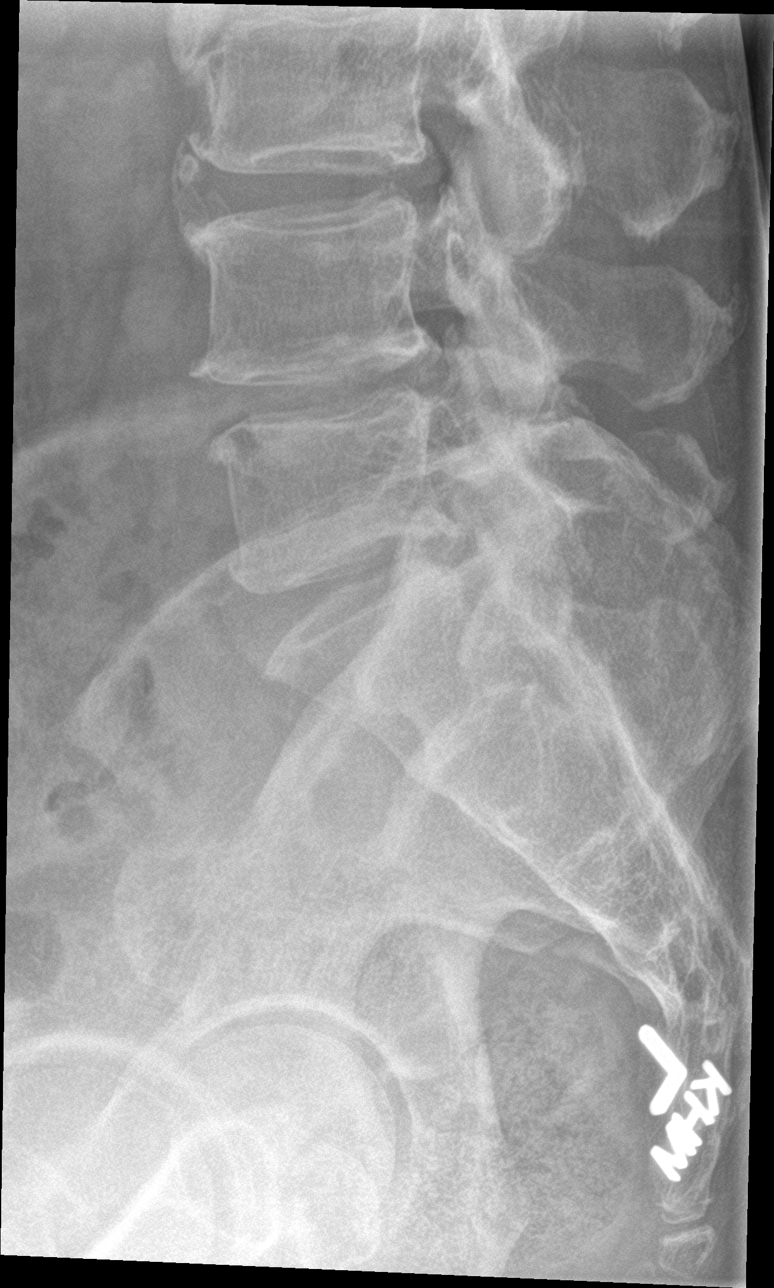

[5 of 5 positions shown; findings below may reference images not displayed]

FINDINGS: Endplate spurring L2-L5. Normal alignment. Vertebral body and disc
height maintained throughout. Negative for fracture. Normal
mineralization. Bilateral pelvic phleboliths.
IMPRESSION: 1. No acute abnormality.

## 2016-12-15 ENCOUNTER — Other Ambulatory Visit: Payer: Self-pay | Admitting: Family Medicine

## 2016-12-16 ENCOUNTER — Other Ambulatory Visit: Payer: Self-pay | Admitting: *Deleted

## 2016-12-16 MED ORDER — HYDROCHLOROTHIAZIDE 25 MG PO TABS: 25.0000 mg | ORAL_TABLET | Freq: Every day | ORAL | 0 refills | Status: DC

## 2016-12-16 MED ORDER — PRAVASTATIN SODIUM 80 MG PO TABS: 80.0000 mg | ORAL_TABLET | Freq: Every evening | ORAL | 0 refills | Status: DC

## 2016-12-16 MED ORDER — AMLODIPINE BESY-BENAZEPRIL HCL 10-20 MG PO CAPS: 1.0000 | ORAL_CAPSULE | Freq: Every day | ORAL | 0 refills | Status: DC

## 2017-01-13 ENCOUNTER — Encounter: Payer: Managed Care, Other (non HMO) | Admitting: Family Medicine

## 2017-01-14 ENCOUNTER — Telehealth: Payer: Self-pay | Admitting: Family Medicine

## 2017-01-14 ENCOUNTER — Other Ambulatory Visit: Payer: Self-pay | Admitting: Family Medicine

## 2017-01-14 DIAGNOSIS — Z125 Encounter for screening for malignant neoplasm of prostate: Secondary | ICD-10-CM

## 2017-01-14 DIAGNOSIS — Z79899 Other long term (current) drug therapy: Secondary | ICD-10-CM

## 2017-01-14 DIAGNOSIS — E119 Type 2 diabetes mellitus without complications: Secondary | ICD-10-CM

## 2017-01-14 DIAGNOSIS — E785 Hyperlipidemia, unspecified: Secondary | ICD-10-CM

## 2017-01-14 NOTE — Telephone Encounter (Signed)
Pt is needing lab orders to be sent over to [facility]. Pt is wanting to go in the morning to have this done. Can we get these put in today and notify the pt so that he can go in the morning. Pt has a physical appt sched. For Thurs. Last labs per epic were: a1c,hepatic,and lipid on 07/09/16.

## 2017-01-14 NOTE — Telephone Encounter (Signed)
Lip liv m7 A1c psa 

## 2017-01-14 NOTE — Telephone Encounter (Signed)
Orders ready. Pt notified.  

## 2017-01-16 ENCOUNTER — Ambulatory Visit (INDEPENDENT_AMBULATORY_CARE_PROVIDER_SITE_OTHER): Payer: Managed Care, Other (non HMO) | Admitting: Family Medicine

## 2017-01-16 ENCOUNTER — Encounter: Payer: Self-pay | Admitting: Family Medicine

## 2017-01-16 VITALS — BP 126/82 | Ht 72.0 in | Wt 200.6 lb

## 2017-01-16 DIAGNOSIS — I10 Essential (primary) hypertension: Secondary | ICD-10-CM

## 2017-01-16 DIAGNOSIS — Z0000 Encounter for general adult medical examination without abnormal findings: Secondary | ICD-10-CM

## 2017-01-16 DIAGNOSIS — E119 Type 2 diabetes mellitus without complications: Secondary | ICD-10-CM | POA: Diagnosis not present

## 2017-01-16 LAB — HEPATIC FUNCTION PANEL
ALBUMIN: 4.5 g/dL (ref 3.5–5.5)
ALT: 38 IU/L (ref 0–44)
AST: 40 IU/L (ref 0–40)
Alkaline Phosphatase: 51 IU/L (ref 39–117)
BILIRUBIN TOTAL: 0.7 mg/dL (ref 0.0–1.2)
Bilirubin, Direct: 0.2 mg/dL (ref 0.00–0.40)
Total Protein: 7.2 g/dL (ref 6.0–8.5)

## 2017-01-16 LAB — LIPID PANEL
CHOL/HDL RATIO: 2.3 ratio (ref 0.0–5.0)
Cholesterol, Total: 122 mg/dL (ref 100–199)
HDL: 54 mg/dL (ref >39)
LDL CALC: 63 mg/dL (ref 0–99)
Triglycerides: 26 mg/dL (ref 0–149)
VLDL CHOLESTEROL CAL: 5 mg/dL (ref 5–40)

## 2017-01-16 LAB — BASIC METABOLIC PANEL
BUN/Creatinine Ratio: 17 (ref 9–20)
BUN: 21 mg/dL (ref 6–24)
CALCIUM: 9.4 mg/dL (ref 8.7–10.2)
CHLORIDE: 99 mmol/L (ref 96–106)
CO2: 26 mmol/L (ref 18–29)
Creatinine, Ser: 1.21 mg/dL (ref 0.76–1.27)
GFR, EST AFRICAN AMERICAN: 77 mL/min/{1.73_m2} (ref >59)
GFR, EST NON AFRICAN AMERICAN: 67 mL/min/{1.73_m2} (ref >59)
Glucose: 90 mg/dL (ref 65–99)
Potassium: 3.7 mmol/L (ref 3.5–5.2)
Sodium: 141 mmol/L (ref 134–144)

## 2017-01-16 LAB — HEMOGLOBIN A1C
ESTIMATED AVERAGE GLUCOSE: 114 mg/dL
Hgb A1c MFr Bld: 5.6 % (ref 4.8–5.6)

## 2017-01-16 LAB — PSA: PROSTATE SPECIFIC AG, SERUM: 0.4 ng/mL (ref 0.0–4.0)

## 2017-01-16 MED ORDER — PRAVASTATIN SODIUM 80 MG PO TABS: 80.0000 mg | ORAL_TABLET | Freq: Every evening | ORAL | 1 refills | Status: DC

## 2017-01-16 MED ORDER — SILDENAFIL CITRATE 20 MG PO TABS: ORAL_TABLET | ORAL | 2 refills | Status: DC

## 2017-01-16 MED ORDER — AMLODIPINE BESY-BENAZEPRIL HCL 10-20 MG PO CAPS: 1.0000 | ORAL_CAPSULE | Freq: Every day | ORAL | 1 refills | Status: DC

## 2017-01-16 MED ORDER — HYDROCHLOROTHIAZIDE 25 MG PO TABS
25.0000 mg | ORAL_TABLET | Freq: Every day | ORAL | 1 refills | Status: DC
Start: 2017-01-16 — End: 2017-07-25

## 2017-01-16 NOTE — Progress Notes (Signed)
Subjective:    Patient ID: Brian Ewing, male    DOB: [birth year], 56 y.o.   MRN: [id number]  HPI The patient comes in today for a wellness visit.    A review of their health history was completed.  A review of medications was also completed.  Any needed refills; yes  Eating habits: trying to eat healthy  Falls/  MVA accidents in past few months: none  Regular exercise: at work  Specialist pt sees on regular basis: none  Preventative health issues were discussed.   Additional concerns: no Results for orders placed or performed in visit on 01/14/17  Lipid panel  Result Value Ref Range   Cholesterol, Total 122 100 - 199 mg/dL   Triglycerides 26 0 - 149 mg/dL   HDL 54 >14>39 mg/dL   VLDL Cholesterol Cal 5 5 - 40 mg/dL   LDL Calculated 63 0 - 99 mg/dL   Chol/HDL Ratio 2.3 0.0 - 5.0 ratio units  Hepatic function panel  Result Value Ref Range   Total Protein 7.2 6.0 - 8.5 g/dL   Albumin 4.5 3.5 - 5.5 g/dL   Bilirubin Total 0.7 0.0 - 1.2 mg/dL   Bilirubin, Direct 7.820.20 0.00 - 0.40 mg/dL   Alkaline Phosphatase 51 39 - 117 IU/L   AST 40 0 - 40 IU/L   ALT 38 0 - 44 IU/L  Basic metabolic panel  Result Value Ref Range   Glucose 90 65 - 99 mg/dL   BUN 21 6 - 24 mg/dL   Creatinine, Ser 9.561.21 0.76 - 1.27 mg/dL   GFR calc non Af Amer 67 >59 mL/min/1.73   GFR calc Af Amer 77 >59 mL/min/1.73   BUN/Creatinine Ratio 17 9 - 20   Sodium 141 134 - 144 mmol/L   Potassium 3.7 3.5 - 5.2 mmol/L   Chloride 99 96 - 106 mmol/L   CO2 26 18 - 29 mmol/L   Calcium 9.4 8.7 - 10.2 mg/dL  Hemoglobin O1HA1c  Result Value Ref Range   Hgb A1c MFr Bld 5.6 4.8 - 5.6 %   Est. average glucose Bld gHb Est-mCnc 114 mg/dL  PSA  Result Value Ref Range   Prostate Specific Ag, Serum 0.4 0.0 - 4.0 ng/mL    Patient claims compliance with diabetes medication. No obvious side effects. Reports no substantial low sugar spells. Most numbers are generally in good range when checked fasting. Generally does not  miss a dose of medication. Watching diabetic diet closely  Blood pressure medicine and blood pressure levels reviewed today with patient. Compliant with blood pressure medicine. States does not miss a dose. No obvious side effects. Blood pressure generally good when checked elsewhere. Watching salt intake.  BP numbers generally 116 over 76 or similar   Glu 76 today      Review of Systems  Constitutional: Negative for activity change, appetite change and fever.  HENT: Negative for congestion and rhinorrhea.   Eyes: Negative for discharge.  Respiratory: Negative for cough and wheezing.   Cardiovascular: Negative for chest pain.  Gastrointestinal: Negative for abdominal pain, blood in stool and vomiting.  Genitourinary: Negative for difficulty urinating and frequency.  Musculoskeletal: Negative for neck pain.  Skin: Negative for rash.  Allergic/Immunologic: Negative for environmental allergies and food allergies.  Neurological: Negative for weakness and headaches.  Psychiatric/Behavioral: Negative for agitation.  All other systems reviewed and are negative.      Objective:   Physical Exam  Constitutional: He appears well-developed and well-nourished.  HENT:  Head: Normocephalic and atraumatic.  Right Ear: External ear normal.  Left Ear: External ear normal.  Nose: Nose normal.  Mouth/Throat: Oropharynx is clear and moist.  Eyes: EOM are normal. Pupils are equal, round, and reactive to light.  Neck: Normal range of motion. Neck supple. No thyromegaly present.  Cardiovascular: Normal rate, regular rhythm and normal heart sounds.   No murmur heard. Pulmonary/Chest: Effort normal and breath sounds normal. No respiratory distress. He has no wheezes.  Abdominal: Soft. Bowel sounds are normal. He exhibits no distension and no mass. There is no tenderness.  Genitourinary: Penis normal.  Musculoskeletal: Normal range of motion. He exhibits no edema.  Lymphadenopathy:    He has no  cervical adenopathy.  Neurological: He is alert. He exhibits normal muscle tone.  Skin: Skin is warm and dry. No erythema.  Psychiatric: He has a normal mood and affect. His behavior is normal. Judgment normal.  Vitals reviewed.         Assessment & Plan:  Impression 1 wellness exam. Up-to-date on colonoscopy no longer doing Hemoccult cards discussed. Patient inquires about shingles shot will explore whether his insurance covers. Diet exercise discussed #2 hypertension good control discussed maintain same meds history type 2 diabetes excellent A1c #4 hyperlipidemia good control plan medications refilled diet exercise discussed

## 2017-01-16 NOTE — Patient Instructions (Signed)
Results for orders placed or performed in visit on 01/14/17  Lipid panel  Result Value Ref Range   Cholesterol, Total 122 100 - 199 mg/dL   Triglycerides 26 0 - 149 mg/dL   HDL 54 >96>39 mg/dL   VLDL Cholesterol Cal 5 5 - 40 mg/dL   LDL Calculated 63 0 - 99 mg/dL   Chol/HDL Ratio 2.3 0.0 - 5.0 ratio units  Hepatic function panel  Result Value Ref Range   Total Protein 7.2 6.0 - 8.5 g/dL   Albumin 4.5 3.5 - 5.5 g/dL   Bilirubin Total 0.7 0.0 - 1.2 mg/dL   Bilirubin, Direct 0.450.20 0.00 - 0.40 mg/dL   Alkaline Phosphatase 51 39 - 117 IU/L   AST 40 0 - 40 IU/L   ALT 38 0 - 44 IU/L  Basic metabolic panel  Result Value Ref Range   Glucose 90 65 - 99 mg/dL   BUN 21 6 - 24 mg/dL   Creatinine, Ser 4.091.21 0.76 - 1.27 mg/dL   GFR calc non Af Amer 67 >59 mL/min/1.73   GFR calc Af Amer 77 >59 mL/min/1.73   BUN/Creatinine Ratio 17 9 - 20   Sodium 141 134 - 144 mmol/L   Potassium 3.7 3.5 - 5.2 mmol/L   Chloride 99 96 - 106 mmol/L   CO2 26 18 - 29 mmol/L   Calcium 9.4 8.7 - 10.2 mg/dL  Hemoglobin W1XA1c  Result Value Ref Range   Hgb A1c MFr Bld 5.6 4.8 - 5.6 %   Est. average glucose Bld gHb Est-mCnc 114 mg/dL  PSA  Result Value Ref Range   Prostate Specific Ag, Serum 0.4 0.0 - 4.0 ng/mL

## 2017-04-14 ENCOUNTER — Encounter: Payer: Self-pay | Admitting: Family Medicine

## 2017-04-14 ENCOUNTER — Ambulatory Visit (INDEPENDENT_AMBULATORY_CARE_PROVIDER_SITE_OTHER): Payer: Managed Care, Other (non HMO) | Admitting: Family Medicine

## 2017-04-14 VITALS — BP 132/82 | Temp 97.9°F | Ht 72.0 in | Wt 207.4 lb

## 2017-04-14 DIAGNOSIS — L0103 Bullous impetigo: Secondary | ICD-10-CM

## 2017-04-14 MED ORDER — DOXYCYCLINE HYCLATE 100 MG PO TABS: ORAL_TABLET | ORAL | 0 refills | Status: DC

## 2017-04-14 MED ORDER — MUPIROCIN 2 % EX OINT: TOPICAL_OINTMENT | CUTANEOUS | 0 refills | Status: DC

## 2017-04-14 NOTE — Progress Notes (Signed)
   Subjective:    Patient ID: Brian Ewing, male    DOB: 01/10/1961, 56 y.o.   MRN: [id number]  HPI Patient in today for possible bite to left ankle. Patient states that bump has gotten bigger since onset. Onset 2 weeks ago. Site is itching and has some discharge.  Has tried putting alcohol on site.   Couple areas started small red bump. Now 1 developed into a frank blister on left ankle. Next  No history of diabetic neuropathy.  No fever or chills   Review of Systems No headache, no major weight loss or weight gain, no chest pain no back pain abdominal pain no change in bowel habits complete ROS otherwise negative     Objective:   Physical Exam  Alert vitals stable, NAD. Blood pressure good on repeat. HEENT normal. Lungs clear. Heart regular rate and rhythm. Left medial ankle very large blister/bulla noted patient was prepped draped incised large amount drain.      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 Doxycycline twice a day wound care management discussed recheck in 48-72 hours

## 2017-04-16 ENCOUNTER — Ambulatory Visit (INDEPENDENT_AMBULATORY_CARE_PROVIDER_SITE_OTHER): Payer: Managed Care, Other (non HMO) | Admitting: Family Medicine

## 2017-04-16 ENCOUNTER — Encounter: Payer: Self-pay | Admitting: Family Medicine

## 2017-04-16 VITALS — BP 124/86 | Temp 98.6°F | Ht 73.0 in | Wt 203.0 lb

## 2017-04-16 DIAGNOSIS — B957 Other staphylococcus as the cause of diseases classified elsewhere: Principal | ICD-10-CM

## 2017-04-16 DIAGNOSIS — L0103 Bullous impetigo: Secondary | ICD-10-CM | POA: Diagnosis not present

## 2017-04-16 DIAGNOSIS — L97901 Non-pressure chronic ulcer of unspecified part of unspecified lower leg limited to breakdown of skin: Secondary | ICD-10-CM

## 2017-04-16 NOTE — Progress Notes (Signed)
   Subjective:    Patient ID: Brian Ewing, male    DOB: 1961/01/16, 56 y.o.   MRN: [id number]  HPIRecheck staphylococcal bullous. Taking bactroban bid and doxy bid. Next  No fever or chills.  No major pain.  Applying dressings twice per day.  Continues to work able to do without major difficulty    Review of Systems No headache, no major weight loss or weight gain, no chest pain no back pain abdominal pain no change in bowel habits complete ROS otherwise negative     Objective:   Physical Exam  Alert and oriented, vitals reviewed and stable, NAD ENT-TM's and ext canals WNL bilat via otoscopic exam Soft palate, tonsils and post pharynx WNL via oropharyngeal exam Neck-symmetric, no masses; thyroid nonpalpable and nontender Pulmonary-no tachypnea or accessory muscle use; Clear without wheezes via auscultation Card--no abnrml murmurs, rhythm reg and rate WNL Carotid pulses symmetric, without bruits Right medial ankle distinct ulceration with central deeper when pulses good sensation intact minimal tenderness no active drainage      Assessment & Plan:  Impression open ankle ulcer. With underlying diabetes and evident venous stasis, definitely at risk for this turning into chronic venous stasis ulcer. Discussed. Plan maintain same approach. For now. [facility] referral rationale discussed many questions answered.  Greater than 50% of this 25 minute face to face visit was spent in counseling and discussion and coordination of care regarding the above diagnosis/diagnosies

## 2017-04-17 ENCOUNTER — Ambulatory Visit: Payer: Managed Care, Other (non HMO) | Admitting: Family Medicine

## 2017-04-21 ENCOUNTER — Encounter: Payer: Managed Care, Other (non HMO) | Attending: Surgery | Admitting: Surgery

## 2017-04-21 DIAGNOSIS — L97321 Non-pressure chronic ulcer of left ankle limited to breakdown of skin: Secondary | ICD-10-CM | POA: Insufficient documentation

## 2017-04-21 DIAGNOSIS — I10 Essential (primary) hypertension: Secondary | ICD-10-CM | POA: Diagnosis not present

## 2017-04-21 DIAGNOSIS — M1990 Unspecified osteoarthritis, unspecified site: Secondary | ICD-10-CM | POA: Diagnosis not present

## 2017-04-21 DIAGNOSIS — Z7984 Long term (current) use of oral hypoglycemic drugs: Secondary | ICD-10-CM | POA: Diagnosis not present

## 2017-04-21 DIAGNOSIS — Z79899 Other long term (current) drug therapy: Secondary | ICD-10-CM | POA: Insufficient documentation

## 2017-04-21 DIAGNOSIS — E11621 Type 2 diabetes mellitus with foot ulcer: Secondary | ICD-10-CM | POA: Diagnosis present

## 2017-04-21 NOTE — Progress Notes (Addendum)
Brian Ewing ([id number]) Visit Report for 04/21/2017 Chief Complaint Document Details Patient Name: Brian Ewing, Brian Ewing. Date of Service: 04/21/2017 1:30 PM Medical Record Number: [id number] Patient Account Number: [id number] Date of Birth/Sex: 02-16-1961 (55 y.o. Male) Treating RN: Phillis Haggis Primary Care Provider: Ardyth Gal Other Clinician: Referring Provider: Ardyth Gal Treating Provider/Extender: Rudene Re in Treatment: 0 Information Obtained from: Patient Chief Complaint Patients presents for treatment of an open diabetic ulcer to the left ankle which she's had for about 2 weeks Electronic Signature(s) Signed: 04/21/2017 2:11:32 PM By: Evlyn Kanner MD, FACS Entered By: Evlyn Kanner on 04/21/2017 14:11:32 Brian Ewing ([id number]) -------------------------------------------------------------------------------- HPI Details Patient Name: Brian Ewing Date of Service: 04/21/2017 1:30 PM Medical Record Number: [id number] Patient Account Number: [id number] Date of Birth/Sex: 10-Sep-1961 (55 y.o. Male) Treating RN: Phillis Haggis Primary Care Provider: Ardyth Gal Other Clinician: Referring Provider: Ardyth Gal Treating Provider/Extender: Rudene Re in Treatment: 0 History of Present Illness Location: Patient presents with an ulcer on the left medial ankle Quality: Patient reports No Pain. Severity: Patient states wound (s) are getting better. Duration: Patient has had the wound for < 2 weeks prior to presenting for treatment Context: The wound appeared gradually over time Modifying Factors: Other treatment(s) tried include: has been taking doxycycline and applying Bactroban Associated Signs and Symptoms: Patient reports having increase swelling. HPI Description: 56 year old patient presented to his PCP earlier this month for a possible bite to his left ankle which has been growing bigger over 2 weeks. he was diagnosed to have a  staphylococcal bullous and Bactroban was applied locally and doxycycline was given. He has been treated in the past for hypertension and diabetes mellitus and his last hemoglobin A1c was 5.6%. He has never been a smoker. Electronic Signature(s) Signed: 04/21/2017 2:12:45 PM By: Evlyn Kanner MD, FACS Previous Signature: 04/21/2017 1:20:18 PM Version By: Evlyn Kanner MD, FACS Entered By: Evlyn Kanner on 04/21/2017 14:12:44 Brian Ewing ([id number]) -------------------------------------------------------------------------------- Physical Exam Details Patient Name: Brian Ewing Date of Service: 04/21/2017 1:30 PM Medical Record Number: [id number] Patient Account Number: [id number] Date of Birth/Sex: 1961-03-09 (56 y.o. Male) Treating RN: Phillis Haggis Primary Care Provider: Ardyth Gal Other Clinician: Referring Provider: Ardyth Gal Treating Provider/Extender: Rudene Re in Treatment: 0 Constitutional . Pulse regular. Respirations normal and unlabored. Afebrile. . Eyes Nonicteric. Reactive to light. Ears, Nose, Mouth, and Throat Lips, teeth, and gums WNL.Marland Kitchen Moist mucosa without lesions. Neck supple and nontender. No palpable supraclavicular or cervical adenopathy. Normal sized without goiter. Respiratory WNL. No retractions.. Cardiovascular Pedal Pulses WNL. ABI on the left was 1.3. No clubbing, cyanosis and there is minimal edema. Chest Breasts symmetical and no nipple discharge.. Breast tissue WNL, no masses, lumps, or tenderness.. Lymphatic No adneopathy. No adenopathy. No adenopathy. Musculoskeletal Adexa without tenderness or enlargement.. Digits and nails w/o clubbing, cyanosis, infection, petechiae, ischemia, or inflammatory conditions.. Integumentary (Hair, Skin) No suspicious lesions. No crepitus or fluctuance. No peri-wound warmth or erythema. No masses.Marland Kitchen Psychiatric Judgement and insight Intact.. No evidence of depression, anxiety, or  agitation.. Notes on the left medial ankle he has a superficial ulceration with a bleb which has been debrided and there is no active inflammation or cellulitis. On the lateral ankle he has a subcutaneous lipoma but other than that there is no open ulceration. Electronic Signature(s) Signed: 04/21/2017 2:24:59 PM By: Evlyn Kanner MD, FACS Entered By: Evlyn Kanner on 04/21/2017 14:24:58 Brian Ewing ([id number]) -------------------------------------------------------------------------------- Physician Orders Details Patient Name: Brian Ewing. Date  of Service: 04/21/2017 1:30 PM Medical Record Number: [id number] Patient Account Number: [id number] Date of Birth/Sex: [birth year](55 y.o. Male) Treating RN: Phillis HaggisPinkerton, Debi Primary Care Provider: Ardyth GalLUKING, Brian Other Clinician: Referring Provider: Ardyth GalLUKING, Brian Treating Provider/Extender: Rudene ReBritto, Lachele Lievanos Weeks in Treatment: 0 Verbal / Phone Orders: Yes Clinician: Pinkerton, Debi Read Back and Verified: Yes Diagnosis Coding Wound Cleansing Wound #1 Left,Medial Malleolus o Clean wound with Normal Saline. o Cleanse wound with mild soap and water o May Shower, gently pat wound dry prior to applying new dressing. Anesthetic Wound #1 Left,Medial Malleolus o Topical Lidocaine 4% cream applied to wound bed prior to debridement Skin Barriers/Peri-Wound Care Wound #1 Left,Medial Malleolus o Skin Prep Primary Wound Dressing Wound #1 Left,Medial Malleolus o Aquacel Ag Secondary Dressing Wound #1 Left,Medial Malleolus o Boardered Foam Dressing Dressing Change Frequency Wound #1 Left,Medial Malleolus o Change dressing every other day. Follow-up Appointments Wound #1 Left,Medial Malleolus o Return Appointment in 1 week. Additional Orders / Instructions Wound #1 Left,Medial Malleolus o Increase protein intake. Brian NatalJOHNSON, Brian W. ([id number]) Medications-please add to medication list. Wound #1 Left,Medial  Malleolus o Other: - Vitamin C, Zinc, MVI Electronic Signature(s) Signed: 04/21/2017 4:03:37 PM By: Evlyn KannerBritto, Alayshia Marini MD, FACS Signed: 04/21/2017 4:39:14 PM By: Alejandro MullingPinkerton, Debra Entered By: Alejandro MullingPinkerton, Debra on 04/21/2017 13:55:17 Brian NatalJOHNSON, Deontrae W. ([id number]) -------------------------------------------------------------------------------- Problem List Details Patient Name: Brian NatalJOHNSON, Takeo W. Date of Service: 04/21/2017 1:30 PM Medical Record Number: [id number] Patient Account Number: [id number] Date of Birth/Sex: [birth year] (55 y.o. 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 ([id number]) -------------------------------------------------------------------------------- Progress Note Details Patient Name: Brian NatalJOHNSON, Arbor W. Date of Service: 04/21/2017 1:30 PM Medical Record Number: [id number] Patient Account Number: [id number] Date of Birth/Sex: [birth year](55 y.o. Male) Treating RN: Phillis HaggisPinkerton, Debi Primary Care Provider: Ardyth GalLUKING, Brian Other Clinician: Referring Provider: Ardyth GalLUKING, Brian Treating Provider/Extender: Rudene ReBritto, Laurence Crofford Weeks in Treatment: 0 Subjective Chief Complaint Information obtained from Patient Patients presents for treatment of an open diabetic ulcer to the left ankle which she's had for about 2 weeks History of Present Illness (HPI) The following HPI elements were documented for the patient's wound: Location:  Patient presents with an ulcer on the left medial ankle Quality: Patient reports No Pain. Severity: Patient states wound (s) are getting better. Duration: Patient has had the wound for < 2 weeks prior to presenting for treatment Context: The wound appeared gradually over time Modifying Factors: Other treatment(s) tried include: has been taking doxycycline and applying Bactroban Associated Signs and Symptoms: Patient reports having increase swelling. 56 year old patient presented to his PCP earlier this month for a possible bite to his left ankle which has been growing bigger over 2 weeks. he was diagnosed to have a staphylococcal bullous and Bactroban was applied locally and doxycycline was given. He has been treated in the past for hypertension and diabetes mellitus and his last hemoglobin A1c was 5.6%. He has never been a smoker. Wound History Patient presents with 1 open wound that has been present for approximately 2 weeks. Patient has been treating wound in the following manner: oral ABT, and ABT cream. Laboratory tests have  not been performed in the last month. Patient reportedly has not tested positive for an antibiotic resistant organism. Patient reportedly has not tested positive for osteomyelitis. Patient reportedly has not had testing performed to evaluate circulation in the legs. Patient experiences the following problems associated with their wounds: swelling. Patient History Information obtained from Patient. Allergies NKDA Family History Brian Ewing, Brian Ewing ([id number]) Cancer - Father, Diabetes - Siblings, Heart Disease - Father, Stroke - Siblings, No family history of Hereditary Spherocytosis, Hypertension, Kidney Disease, Lung Disease, Seizures, Thyroid Problems, Tuberculosis. Social History Never smoker, Marital Status - Married, Alcohol Use - Never, Drug Use - No History, Caffeine Use - Never. Medical History Cardiovascular Patient has history of  Hypertension Endocrine Patient has history of Type II Diabetes Musculoskeletal Patient has history of Osteoarthritis Patient is treated with Controlled Diet. Blood sugar is not tested. Review of Systems (ROS) Constitutional Symptoms (General Health) The patient has no complaints or symptoms. Eyes Complains or has symptoms of Glasses / Contacts. Ear/Nose/Mouth/Throat The patient has no complaints or symptoms. Hematologic/Lymphatic The patient has no complaints or symptoms. Respiratory The patient has no complaints or symptoms. Cardiovascular hyper cholesterolemia Gastrointestinal The patient has no complaints or symptoms. Genitourinary The patient has no complaints or symptoms. Immunological The patient has no complaints or symptoms. Integumentary (Skin) The patient has no complaints or symptoms. Neurologic The patient has no complaints or symptoms. Oncologic The patient has no complaints or symptoms. Psychiatric The patient has no complaints or symptoms. Medications amlodipine 10 mg-benazepril 20 mg capsule oral 1 1 capsule oral daily RAIDYN, WASSINK. ([id number]) pravastatin 80 mg tablet oral 1 1 tablet oral nightly aspirin 81 mg tablet,delayed release oral 1 1 tablet,delayed release (DR/EC) oral daily Revatio 20 mg tablet oral two and one half tablets (50 mg.) as needed doxycycline hyclate 100 mg tablet oral 1 1 tablet oral two times daily for 10 days hydrochlorothiazide 25 mg tablet oral 1 1 tablet oral daily Cortisporin 1 % topical ointment topical ointment topical apply to toe two times daily mupirocin 2 % topical ointment topical ointment topical apply two times daily Objective Constitutional Pulse regular. Respirations normal and unlabored. Afebrile. Vitals Time Taken: 1:18 PM, Height: 73 in, Source: Stated, Weight: 203 lbs, Source: Measured, BMI: 26.8, Temperature: 98.3 F, Pulse: 68 bpm, Respiratory Rate: 20 breaths/min, Blood Pressure: 119/61  mmHg. Eyes Nonicteric. Reactive to light. Ears, Nose, Mouth, and Throat Lips, teeth, and gums WNL.Marland Kitchen Moist mucosa without lesions. Neck supple and nontender. No palpable supraclavicular or cervical adenopathy. Normal sized without goiter. Respiratory WNL. No retractions.. Cardiovascular Pedal Pulses WNL. ABI on the left was 1.3. No clubbing, cyanosis and there is minimal edema. Chest Breasts symmetical and no nipple discharge.. Breast tissue WNL, no masses, lumps, or tenderness.. Lymphatic No adneopathy. No adenopathy. No adenopathy. Musculoskeletal Adexa without tenderness or enlargement.. Digits and nails w/o clubbing, cyanosis, infection, petechiae, ischemia, or inflammatory conditions.Marland Kitchen Psychiatric Judgement and insight Intact.. No evidence of depression, anxiety, or agitation.Marland Kitchen Brian Ewing, Brian Ewing ([id number]) General Notes: on the left medial ankle he has a superficial ulceration with a bleb which has been debrided and there is no active inflammation or cellulitis. On the lateral ankle he has a subcutaneous lipoma but other than that there is no open ulceration. Integumentary (Hair, Skin) No suspicious lesions. No crepitus or fluctuance. No peri-wound warmth or erythema. No masses.. Wound #1 status is Open. Original cause of wound was Gradually Appeared. The wound is located on the Left,Medial Malleolus. The wound measures 4cm length x  3.4cm width x 0.1cm depth; 10.681cm^2 area and 1.068cm^3 volume. There is no tunneling or undermining noted. There is a large amount of serous drainage noted. The wound margin is flat and intact. There is large (67-100%) red granulation within the wound bed. There is no necrotic tissue within the wound bed. Periwound temperature was noted as No Abnormality. The periwound has tenderness on palpation. 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 This bleb on his left medial ankle is fairly superficial and may have arisen because of new work boots. In either case he has already been started on oral antibiotics and at this stage I have recommended silver alginate and bordered foam and I have discussed offloading with him in great detail. We will review him back next week and make appropriate adjustments to his treatment Plan Wound Cleansing: Wound #1 Left,Medial Malleolus: Clean wound with Normal Saline. Cleanse wound with mild soap and water May Shower, gently pat wound dry prior to applying new dressing. Anesthetic: Wound #1 Left,Medial Malleolus: Topical Lidocaine 4% cream applied to wound bed prior to debridement Skin Barriers/Peri-Wound Care: Wound #1 Left,Medial Malleolus: Brian Ewing, Brian Ewing ([id number]) Skin Prep Primary Wound Dressing: Wound #1 Left,Medial Malleolus: Aquacel Ag Secondary Dressing: Wound #1 Left,Medial Malleolus: Boardered Foam Dressing Dressing Change Frequency: Wound #1 Left,Medial Malleolus: Change dressing every other day. Follow-up Appointments: Wound #1 Left,Medial Malleolus: Return Appointment in 1 week. Additional Orders / Instructions: Wound #1 Left,Medial Malleolus: Increase protein intake. Medications-please add to medication list.: Wound #1 Left,Medial Malleolus: Other: - Vitamin C, Zinc, MVI 56 year old gentleman who is otherwise healthy and has diet controlled diabetes mellitus which is doing very well. This bleb on his left medial ankle is fairly superficial and may have arisen because of new work boots. In either case he has already been started on oral antibiotics and at this stage I have recommended silver alginate and bordered foam and I have discussed offloading with him in great detail. We will review him back next week and make appropriate  adjustments to his treatment Electronic Signature(s) Signed: 04/21/2017 2:26:37 PM By: Evlyn Kanner MD, FACS Entered By: Evlyn Kanner on 04/21/2017 14:26:36 Brian Ewing ([id number]) -------------------------------------------------------------------------------- ROS/PFSH Details Patient Name: Brian Ewing Date of Service: 04/21/2017 1:30 PM Medical Record Number: [id number] Patient Account Number: [id number] Date of Birth/Sex: 03-04-1961 (56 y.o. Male) Treating RN: Phillis Haggis Primary Care Provider: Ardyth Gal Other Clinician: Referring Provider: Ardyth Gal Treating Provider/Extender: Rudene Re in Treatment: 0 Information Obtained From Patient Wound History Do you currently have one or more open woundso Yes How many open wounds do you currently haveo 1 Approximately how long have you had your woundso 2 weeks How have you been treating your wound(s) until nowo oral ABT, and ABT cream Has your wound(s) ever healed and then re-openedo No Have you had any lab work done in the past montho No Have you tested positive for an antibiotic resistant organism (MRSA, No VRE)o Have you tested positive for osteomyelitis (bone infection)o No Have you had any tests for circulation on your legso No Have you had other problems associated with your woundso Swelling Eyes Complaints and Symptoms: Positive for: Glasses / Contacts Constitutional Symptoms (General Health) Complaints and Symptoms: No Complaints or Symptoms Ear/Nose/Mouth/Throat Complaints and Symptoms: No Complaints or Symptoms Hematologic/Lymphatic  Complaints and Symptoms: No Complaints or Symptoms Respiratory Complaints and Symptoms: No Complaints or Symptoms Cardiovascular Brian Ewing, Brian Ewing ([id number]) Complaints and Symptoms: Review of System Notes: hyper cholesterolemia Medical History: Positive for: Hypertension Gastrointestinal Complaints and Symptoms: No Complaints or  Symptoms Endocrine Medical History: Positive for: Type II Diabetes Time with diabetes: 2 yrs Treated with: Diet Blood sugar tested every day: No Genitourinary Complaints and Symptoms: No Complaints or Symptoms Immunological Complaints and Symptoms: No Complaints or Symptoms Integumentary (Skin) Complaints and Symptoms: No Complaints or Symptoms Musculoskeletal Medical History: Positive for: Osteoarthritis Neurologic Complaints and Symptoms: No Complaints or Symptoms Oncologic Complaints and Symptoms: No Complaints or Symptoms Psychiatric Brian Ewing, MISSEY. ([id number]) Complaints and Symptoms: No Complaints or Symptoms Immunizations Pneumococcal Vaccine: Received Pneumococcal Vaccination: Yes Family and Social History Cancer: Yes - Father; Diabetes: Yes - Siblings; Heart Disease: Yes - Father; Hereditary Spherocytosis: No; Hypertension: No; Kidney Disease: No; Lung Disease: No; Seizures: No; Stroke: Yes - Siblings; Thyroid Problems: No; Tuberculosis: No; Never smoker; Marital Status - Married; Alcohol Use: Never; Drug Use: No History; Caffeine Use: Never; Financial Concerns: No; Food, Clothing or Shelter Needs: No; Support System Lacking: No; Transportation Concerns: No; Advanced Directives: No; Patient does not want information on Advanced Directives; Do not resuscitate: No; Living Will: No; Medical Power of Attorney: No Physician Affirmation I have reviewed and agree with the above information. Electronic Signature(s) Signed: 04/21/2017 4:03:37 PM By: Evlyn Kanner MD, FACS Signed: 04/21/2017 4:39:14 PM By: Alejandro Mulling Entered By: Evlyn Kanner on 04/21/2017 13:45:36 Brian Ewing ([id number]) -------------------------------------------------------------------------------- SuperBill Details Patient Name: Brian Ewing Date of Service: 04/21/2017 Medical Record Number: [id number] Patient Account Number: [id number] Date of Birth/Sex: December 26, 1960 (55 y.o.  Male) Treating RN: Phillis Haggis Primary Care Provider: Ardyth Gal Other Clinician: Referring Provider: Ardyth Gal Treating Provider/Extender: Rudene Re in Treatment: 0 Diagnosis Coding ICD-10 Codes Code Description E11.621 Type 2 diabetes mellitus with foot ulcer L97.321 Non-pressure chronic ulcer of left ankle limited to breakdown of skin Facility Procedures CPT4 Code: [id number] Description: 99214 - WOUND CARE VISIT-LEV 4 EST PT Modifier: Quantity: 1 Physician Procedures CPT4 Code Description: 3244010 [id number] - WC PHYS LEVEL 4 - NEW PT ICD-10 Description Diagnosis E11.621 Type 2 diabetes mellitus with foot ulcer 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

## 2017-04-22 NOTE — Progress Notes (Signed)
Brian Ewing, Loyalty W. ([id number]) Visit Report for 04/21/2017 Allergy List Details Patient Name: Brian Ewing, Brian W. Date of Service: 04/21/2017 1:30 PM Medical Record Number: [id number] Patient Account Number: [id number] Date of Birth/Sex: 01-Aug-1961 (55 y.o. Male) Treating RN: Phillis HaggisPinkerton, Debi Primary Care Tyrin Herbers: Ardyth GalLUKING, WILLIAM Other Clinician: Referring Fendi Meinhardt: Ardyth GalLUKING, WILLIAM Treating Etha Stambaugh/Extender: Rudene ReBritto, Errol Weeks in Treatment: 0 Allergies Active Allergies NKDA Allergy Notes Electronic Signature(s) Signed: 04/21/2017 4:39:14 PM By: Alejandro MullingPinkerton, Debra Entered By: Alejandro MullingPinkerton, Debra on 04/21/2017 13:21:33 Brian Ewing, Brian W. ([id number]) -------------------------------------------------------------------------------- Arrival Information Details Patient Name: Brian Ewing, Brian W. Date of Service: 04/21/2017 1:30 PM Medical Record Number: [id number] Patient Account Number: [id number] Date of Birth/Sex: 01-Aug-1961 (55 y.o. Male) Treating RN: Phillis HaggisPinkerton, Debi Primary Care Stephannie Broner: Ardyth GalLUKING, WILLIAM Other Clinician: Referring Nylah Butkus: Ardyth GalLUKING, WILLIAM Treating Adaleena Mooers/Extender: Rudene ReBritto, Errol Weeks in Treatment: 0 Visit Information Patient Arrived: Ambulatory Arrival Time: 13:17 Accompanied By: wife Transfer Assistance: None Patient Identification Verified: Yes Secondary Verification Process Yes Completed: Patient Requires Transmission- No Based Precautions: Patient Has Alerts: Yes Patient Alerts: DM II (Diet Controlled) Electronic Signature(s) Signed: 04/21/2017 4:39:14 PM By: Alejandro MullingPinkerton, Debra Entered By: Alejandro MullingPinkerton, Debra on 04/21/2017 13:18:19 Brian Ewing, Brian W. ([id number]) -------------------------------------------------------------------------------- Clinic Level of Care Assessment Details Patient Name: Brian Ewing, Brian W. Date of Service: 04/21/2017 1:30 PM Medical Record Number: [id number] Patient Account Number: [id number] Date of Birth/Sex: 01-Aug-1961 (55 y.o.  Male) Treating RN: Phillis HaggisPinkerton, Debi Primary Care Gaynel Schaafsma: Ardyth GalLUKING, WILLIAM Other Clinician: Referring Dinh Ayotte: Ardyth GalLUKING, WILLIAM Treating Kerston Landeck/Extender: Rudene ReBritto, Errol Weeks in Treatment: 0 Clinic Level of Care Assessment Items TOOL 2 Quantity Score X - Use when only an EandM is performed on the INITIAL visit 1 0 ASSESSMENTS - Nursing Assessment / Reassessment X - General Physical Exam (combine w/ comprehensive assessment (listed just 1 20 below) when performed on new pt. evals) X - Comprehensive Assessment (HX, ROS, Risk Assessments, Wounds Hx, etc.) 1 25 ASSESSMENTS - Wound and Skin Assessment / Reassessment X - Simple Wound Assessment / Reassessment - one wound 1 5 []  - Complex Wound Assessment / Reassessment - multiple wounds 0 []  - Dermatologic / Skin Assessment (not related to wound area) 0 ASSESSMENTS - Ostomy and/or Continence Assessment and Care []  - Incontinence Assessment and Management 0 []  - Ostomy Care Assessment and Management (repouching, etc.) 0 PROCESS - Coordination of Care []  - Simple Patient / Family Education for ongoing care 0 X - Complex (extensive) Patient / Family Education for ongoing care 1 20 X - Staff obtains ChiropractorConsents, Records, Test Results / Process Orders 1 10 []  - Staff telephones HHA, Nursing Homes / Clarify orders / etc 0 []  - Routine Transfer to another Facility (non-emergent condition) 0 []  - Routine Hospital Admission (non-emergent condition) 0 X - New Admissions / Manufacturing engineernsurance Authorizations / Ordering NPWT, Apligraf, etc. 1 15 []  - Emergency Hospital Admission (emergent condition) 0 X - Simple Discharge Coordination 1 10 Brian Ewing, Brian W. ([id number]) []  - Complex (extensive) Discharge Coordination 0 PROCESS - Special Needs []  - Pediatric / Minor Patient Management 0 []  - Isolation Patient Management 0 []  - Hearing / Language / Visual special needs 0 []  - Assessment of Community assistance (transportation, D/C planning, etc.) 0 []  -  Additional assistance / Altered mentation 0 []  - Support Surface(s) Assessment (bed, cushion, seat, etc.) 0 INTERVENTIONS - Wound Cleansing / Measurement X - Wound Imaging (photographs - any number of wounds) 1 5 []  - Wound Tracing (instead of photographs) 0 X - Simple Wound Measurement - one wound 1 5 []  - Complex Wound Measurement -  multiple wounds 0 X - Simple Wound Cleansing - one wound 1 5 []  - Complex Wound Cleansing - multiple wounds 0 INTERVENTIONS - Wound Dressings X - Small Wound Dressing one or multiple wounds 1 10 []  - Medium Wound Dressing one or multiple wounds 0 []  - Large Wound Dressing one or multiple wounds 0 []  - Application of Medications - injection 0 INTERVENTIONS - Miscellaneous []  - External ear exam 0 []  - Specimen Collection (cultures, biopsies, blood, body fluids, etc.) 0 []  - Specimen(s) / Culture(s) sent or taken to Lab for analysis 0 []  - Patient Transfer (multiple staff / Michiel Sites Lift / Similar devices) 0 []  - Simple Staple / Suture removal (25 or less) 0 []  - Complex Staple / Suture removal (26 or more) 0 EMANUAL, [place]. ([id number]) []  - Hypo / Hyperglycemic Management (close monitor of Blood Glucose) 0 X - Ankle / Brachial Index (ABI) - do not check if billed separately 1 15 Has the patient been seen at the hospital within the last three years: Yes Total Score: 145 Level Of Care: New/Established - Level 4 Electronic Signature(s) Signed: 04/21/2017 4:39:14 PM By: Alejandro Mulling Entered By: Alejandro Mulling on 04/21/2017 14:31:05 Brian Ewing ([id number]) -------------------------------------------------------------------------------- Encounter Discharge Information Details Patient Name: Brian Ewing Date of Service: 04/21/2017 1:30 PM Medical Record Number: [id number] Patient Account Number: [id number] Date of Birth/Sex: [birth year] (55 y.o. Male) Treating RN: Phillis Haggis Primary Care Gaetano Romberger: Ardyth Gal Other  Clinician: Referring Namita Yearwood: Ardyth Gal Treating Nayshawn Mesta/Extender: Rudene Re in Treatment: 0 Encounter Discharge Information Items Discharge Pain Level: 0 Discharge Condition: Stable Ambulatory Status: Ambulatory Discharge Destination: Home Transportation: Private Auto Accompanied By: wife Schedule Follow-up Appointment: Yes Medication Reconciliation completed and provided to Patient/Care No Cristhian Vanhook: Provided on Clinical Summary of Care: 04/21/2017 Form Type Recipient Paper Patient Ellard Artis Electronic Signature(s) Signed: 04/21/2017 2:04:26 PM By: Gwenlyn Perking Entered By: Gwenlyn Perking on 04/21/2017 14:04:26 Brian Ewing ([id number]) -------------------------------------------------------------------------------- Lower Extremity Assessment Details Patient Name: Brian Ewing Date of Service: 04/21/2017 1:30 PM Medical Record Number: [id number] Patient Account Number: [id number] Date of Birth/Sex: [birth year] (55 y.o. Male) Treating RN: Phillis Haggis Primary Care Carsyn Taubman: Ardyth Gal Other Clinician: Referring Rhyanna Sorce: Ardyth Gal Treating Analiyah Lechuga/Extender: Rudene Re in Treatment: 0 Edema Assessment Assessed: [Left: No] [Right: No] Edema: [Left: N] [Right: o] Calf Left: Right: Point of Measurement: 40 cm From Medial Instep 37 cm cm Ankle Left: Right: Point of Measurement: 12 cm From Medial Instep 22.5 cm cm Vascular Assessment Pulses: Dorsalis Pedis Palpable: [Left:Yes] Doppler Audible: [Left:Yes] Posterior Tibial Palpable: [Left:Yes] Doppler Audible: [Left:Yes] Extremity colors, hair growth, and conditions: Extremity Color: [Left:Normal] Temperature of Extremity: [Left:Warm] Capillary Refill: [Left:< 3 seconds] Blood Pressure: Brachial: [Left:100] Dorsalis Pedis: 118 [Left:Dorsalis Pedis:] Ankle: Posterior Tibial: 130 [Left:Posterior Tibial: 1.30] Toe Nail Assessment Left: Right: Thick: No Discolored:  No Deformed: No Improper Length and Hygiene: No TODRICK, SIEDSCHLAG ([id number]) Electronic Signature(s) Signed: 04/21/2017 4:39:14 PM By: Alejandro Mulling Entered By: Alejandro Mulling on 04/21/2017 13:41:21 Brian Ewing ([id number]) -------------------------------------------------------------------------------- Multi Wound Chart Details Patient Name: Brian Ewing Date of Service: 04/21/2017 1:30 PM Medical Record Number: [id number] Patient Account Number: [id number] Date of Birth/Sex: 1961-02-29 (56 y.o. Male) Treating RN: Phillis Haggis Primary Care Natassia Guthridge: Ardyth Gal Other Clinician: Referring Chukwuemeka Artola: Ardyth Gal Treating Oline Belk/Extender: Rudene Re in Treatment: 0 Vital Signs Height(in): 73 Pulse(bpm): 68 Weight(lbs): 203 Blood Pressure 119/61 (mmHg): Body Mass Index(BMI): 27 Temperature(F): 98.3 Respiratory Rate 20 (breaths/min): Photos: [1:No Photos] [N/A:N/A] Wound  Location: [1:Left Malleolus - Medial] [N/A:N/A] Wounding Event: [1:Gradually Appeared] [N/A:N/A] Primary Etiology: [1:To be determined] [N/A:N/A] Comorbid History: [1:Hypertension, Type II Diabetes, Osteoarthritis] [N/A:N/A] Date Acquired: [1:04/07/2017] [N/A:N/A] Weeks of Treatment: [1:0] [N/A:N/A] Wound Status: [1:Open] [N/A:N/A] Measurements L x W x D 4x3.4x0.1 [N/A:N/A] (cm) Area (cm) : [1:10.681] [N/A:N/A] Volume (cm) : [1:1.068] [N/A:N/A] Classification: [1:Partial Thickness] [N/A:N/A] HBO Classification: [1:Grade 1] [N/A:N/A] Exudate Amount: [1:Large] [N/A:N/A] Exudate Type: [1:Serous] [N/A:N/A] Exudate Color: [1:amber] [N/A:N/A] Wound Margin: [1:Flat and Intact] [N/A:N/A] Granulation Amount: [1:Large (67-100%)] [N/A:N/A] Granulation Quality: [1:Red] [N/A:N/A] Necrotic Amount: [1:None Present (0%)] [N/A:N/A] Epithelialization: [1:None] [N/A:N/A] Periwound Skin Texture: No Abnormalities Noted [N/A:N/A] Periwound Skin [1:No Abnormalities Noted]  [N/A:N/A] Moisture: Periwound Skin Color: No Abnormalities Noted [N/A:N/A] Temperature: [1:No Abnormality Yes] [N/A:N/A N/A] Tenderness on Palpation: Wound Preparation: Ulcer Cleansing: N/A N/A Rinsed/Irrigated with Saline Topical Anesthetic Applied: Other: lidocaine 4% Treatment Notes Wound #1 (Left, Medial Malleolus) 1. Cleansed with: Clean wound with Normal Saline 2. Anesthetic Topical Lidocaine 4% cream to wound bed prior to debridement 3. Peri-wound Care: Skin Prep 4. Dressing Applied: Aquacel Ag 5. Secondary Dressing Applied Bordered Foam Dressing Electronic Signature(s) Signed: 04/21/2017 2:11:10 PM By: Evlyn Kanner MD, FACS Entered By: Evlyn Kanner on 04/21/2017 14:11:10 Brian Ewing ([id number]) -------------------------------------------------------------------------------- Multi-Disciplinary Care Plan Details Patient Name: Brian Ewing Date of Service: 04/21/2017 1:30 PM Medical Record Number: [id number] Patient Account Number: [id number] Date of Birth/Sex: Oct 26, 1961 (55 y.o. Male) Treating RN: Phillis Haggis Primary Care Haroun Cotham: Ardyth Gal Other Clinician: Referring Tristan Bramble: Ardyth Gal Treating Lucrecia Mcphearson/Extender: Rudene Re in Treatment: 0 Active Inactive ` Orientation to the Wound Care Program Nursing Diagnoses: Knowledge deficit related to the wound healing center program Goals: Patient/caregiver will verbalize understanding of the Wound Healing Center Program Date Initiated: 04/21/2017 Target Resolution Date: 05/17/2017 Goal Status: Active Interventions: Provide education on orientation to the wound center Notes: ` Pain, Acute or Chronic Nursing Diagnoses: Pain, acute or chronic: actual or potential Potential alteration in comfort, pain Goals: Patient/caregiver will verbalize adequate pain control between visits Date Initiated: 04/21/2017 Target Resolution Date: 07/19/2017 Goal Status:  Active Interventions: Complete pain assessment as per visit requirements Notes: ` Wound/Skin Impairment Nursing Diagnoses: Impaired tissue integrity CHUN, SELLEN ([id number]) Knowledge deficit related to ulceration/compromised skin integrity Goals: Ulcer/skin breakdown will have a volume reduction of 80% by week 12 Date Initiated: 04/21/2017 Target Resolution Date: 07/19/2017 Goal Status: Active Interventions: Assess patient/caregiver ability to perform ulcer/skin care regimen upon admission and as needed Assess ulceration(s) every visit Notes: Electronic Signature(s) Signed: 04/21/2017 4:39:14 PM By: Alejandro Mulling Entered By: Alejandro Mulling on 04/21/2017 13:57:06 Brian Ewing ([id number]) -------------------------------------------------------------------------------- Pain Assessment Details Patient Name: Brian Ewing Date of Service: 04/21/2017 1:30 PM Medical Record Number: [id number] Patient Account Number: [id number] Date of Birth/Sex: [birth year] (55 y.o. Male) Treating RN: Phillis Haggis Primary Care Francis Doenges: Ardyth Gal Other Clinician: Referring Zaria Taha: Ardyth Gal Treating Selena Swaminathan/Extender: Rudene Re in Treatment: 0 Active Problems Location of Pain Severity and Description of Pain Patient Has Paino No Site Locations With Dressing Change: No Pain Management and Medication Current Pain Management: Electronic Signature(s) Signed: 04/21/2017 4:39:14 PM By: Alejandro Mulling Entered By: Alejandro Mulling on 04/21/2017 13:18:33 Brian Ewing ([id number]) -------------------------------------------------------------------------------- Patient/Caregiver Education Details Patient Name: Brian Ewing Date of Service: 04/21/2017 1:30 PM Medical Record Number: [id number] Patient Account Number: [id number] Date of Birth/Gender: 06-03-1961 (56 y.o. Male) Treating RN: Phillis Haggis Primary Care Physician: Ardyth Gal Other  Clinician: Referring Physician: Ardyth Gal Treating Physician/Extender: Rudene Re in  Treatment: 0 Education Assessment Education Provided To: Patient Education Topics Provided Welcome To The Wound Care Center: Handouts: Welcome To The [facility] Methods: Explain/Verbal Responses: State content correctly Wound/Skin Impairment: Handouts: Other: change dressing as ordered Methods: Demonstration, Explain/Verbal Responses: State content correctly Electronic Signature(s) Signed: 04/21/2017 4:39:14 PM By: Alejandro Mulling Entered By: Alejandro Mulling on 04/21/2017 13:57:19 Brian Ewing ([id number]) -------------------------------------------------------------------------------- Wound Assessment Details Patient Name: Brian Ewing Date of Service: 04/21/2017 1:30 PM Medical Record Number: [id number] Patient Account Number: [id number] Date of Birth/Sex: [birth year] (56 y.o. Male) Treating RN: Phillis Haggis Primary Care Ruthie Berch: Ardyth Gal Other Clinician: Referring Irena Gaydos: Ardyth Gal Treating Magdiel Bartles/Extender: Rudene Re in Treatment: 0 Wound Status Wound Number: 1 Primary To be determined Etiology: Wound Location: Left Malleolus - Medial Wound Status: Open Wounding Event: Gradually Appeared Comorbid Hypertension, Type II Diabetes, Date Acquired: 04/07/2017 History: Osteoarthritis Weeks Of Treatment: 0 Clustered Wound: No Photos Photo Uploaded By: Alejandro Mulling on 04/21/2017 16:34:42 Wound Measurements Length: (cm) 4 Width: (cm) 3.4 Depth: (cm) 0.1 Area: (cm) 10.681 Volume: (cm) 1.068 % Reduction in Area: % Reduction in Volume: Epithelialization: None Tunneling: No Undermining: No Wound Description Classification: Partial Thickness Foul Odor A Diabetic Severity (Wagner): Grade 1 Slough/Fibr Wound Margin: Flat and Intact Exudate Amount: Large Exudate Type: Serous Exudate Color: amber fter Cleansing: No ino  No Wound Bed Granulation Amount: Large (67-100%) Granulation Quality: Red Necrotic Amount: None Present (0%) ZAMIER, EGGEBRECHT. ([id number]) Periwound Skin Texture Texture Color No Abnormalities Noted: No No Abnormalities Noted: No Moisture Temperature / Pain No Abnormalities Noted: No Temperature: No Abnormality Tenderness on Palpation: Yes Wound Preparation Ulcer Cleansing: Rinsed/Irrigated with Saline Topical Anesthetic Applied: Other: lidocaine 4%, Treatment Notes Wound #1 (Left, Medial Malleolus) 1. Cleansed with: Clean wound with Normal Saline 2. Anesthetic Topical Lidocaine 4% cream to wound bed prior to debridement 3. Peri-wound Care: Skin Prep 4. Dressing Applied: Aquacel Ag 5. Secondary Dressing Applied Bordered Foam Dressing Electronic Signature(s) Signed: 04/21/2017 4:39:14 PM By: Alejandro Mulling Entered By: Alejandro Mulling on 04/21/2017 13:43:51 Brian Ewing ([id number]) -------------------------------------------------------------------------------- Vitals Details Patient Name: Brian Ewing Date of Service: 04/21/2017 1:30 PM Medical Record Number: [id number] Patient Account Number: [id number] Date of Birth/Sex: 1960-12-30 (55 y.o. Male) Treating RN: Phillis Haggis Primary Care Estreya Clay: Ardyth Gal Other Clinician: Referring Fountain Derusha: Ardyth Gal Treating Hitomi Slape/Extender: Rudene Re in Treatment: 0 Vital Signs Time Taken: 13:18 Temperature (F): 98.3 Height (in): 73 Pulse (bpm): 68 Source: Stated Respiratory Rate (breaths/min): 20 Weight (lbs): 203 Blood Pressure (mmHg): 119/61 Source: Measured Reference Range: 80 - 120 mg / dl Body Mass Index (BMI): 26.8 Electronic Signature(s) Signed: 04/21/2017 4:39:14 PM By: Alejandro Mulling Entered By: Alejandro Mulling on 04/21/2017 13:20:21

## 2017-04-22 NOTE — Progress Notes (Signed)
Brian Ewing, Brian W. ([id number]) Visit Report for 04/21/2017 Abuse/Suicide Risk Screen Details Patient Name: Brian Ewing, Brian W. Date of Service: 04/21/2017 1:30 PM Medical Record Number: [id number] Patient Account Number: [id number] Date of Birth/Sex: 02/08/1961 (56 y.o. Male) Treating RN: Phillis HaggisPinkerton, Debi Primary Care Correen Bubolz: Ardyth GalLUKING, WILLIAM Other Clinician: Referring Esau Fridman: Ardyth GalLUKING, WILLIAM Treating Santo Zahradnik/Extender: Rudene ReBritto, Errol Weeks in Treatment: 0 Abuse/Suicide Risk Screen Items Answer ABUSE/SUICIDE RISK SCREEN: Has anyone close to you tried to hurt or harm you recentlyo No Do you feel uncomfortable with anyone in your familyo No Has anyone forced you do things that you didnot want to doo No Do you have any thoughts of harming yourselfo No Patient displays signs or symptoms of abuse and/or neglect. No Electronic Signature(s) Signed: 04/21/2017 4:39:14 PM By: Alejandro MullingPinkerton, Debra Entered By: Alejandro MullingPinkerton, Debra on 04/21/2017 13:26:56 Brian Ewing, Brian W. ([id number]) -------------------------------------------------------------------------------- Activities of Daily Living Details Patient Name: Brian Ewing, Evaan W. Date of Service: 04/21/2017 1:30 PM Medical Record Number: [id number] Patient Account Number: [id number] Date of Birth/Sex: 02/08/1961 (56 y.o. Male) Treating RN: Phillis HaggisPinkerton, Debi Primary Care Kenroy Timberman: Ardyth GalLUKING, WILLIAM Other Clinician: Referring Develle Sievers: Ardyth GalLUKING, WILLIAM Treating Salih Williamson/Extender: Rudene ReBritto, Errol Weeks in Treatment: 0 Activities of Daily Living Items Answer Activities of Daily Living (Please select one for each item) Drive Automobile Completely Able Take Medications Completely Able Use Telephone Completely Able Care for Appearance Completely Able Use Toilet Completely Able Bath / Shower Completely Able Dress Self Completely Able Feed Self Completely Able Walk Completely Able Get In / Out Bed Completely Able Housework Completely Able Prepare Meals  Completely Able Handle Money Completely Able Shop for Self Completely Able Electronic Signature(s) Signed: 04/21/2017 4:39:14 PM By: Alejandro MullingPinkerton, Debra Entered By: Alejandro MullingPinkerton, Debra on 04/21/2017 13:27:17 Brian Ewing, Brian W. ([id number]) -------------------------------------------------------------------------------- Education Assessment Details Patient Name: Brian Ewing, Brian W. Date of Service: 04/21/2017 1:30 PM Medical Record Number: [id number] Patient Account Number: [id number] Date of Birth/Sex: 02/08/1961 (56 y.o. Male) Treating RN: Phillis HaggisPinkerton, Debi Primary Care Kailan Carmen: Ardyth GalLUKING, WILLIAM Other Clinician: Referring Kayhan Boardley: Ardyth GalLUKING, WILLIAM Treating Luca Dyar/Extender: Rudene ReBritto, Errol Weeks in Treatment: 0 Primary Learner Assessed: Patient Learning Preferences/Education Level/Primary Language Learning Preference: Explanation, Printed Material Highest Education Level: High School Preferred Language: English Cognitive Barrier Assessment/Beliefs Language Barrier: No Translator Needed: No Memory Deficit: No Emotional Barrier: No Cultural/Religious Beliefs Affecting Medical No Care: Physical Barrier Assessment Impaired Vision: Yes Glasses Impaired Hearing: No Decreased Hand dexterity: No Knowledge/Comprehension Assessment Knowledge Level: Medium Comprehension Level: Medium Ability to understand written Medium instructions: Ability to understand verbal Medium instructions: Motivation Assessment Anxiety Level: Calm Cooperation: Cooperative Education Importance: Acknowledges Need Interest in Health Problems: Asks Questions Perception: Coherent Willingness to Engage in Self- Medium Management Activities: Readiness to Engage in Self- Medium Management Activities: Electronic Signature(s) Brian Ewing, Brian W. ([id number]) Signed: 04/21/2017 4:39:14 PM By: Alejandro MullingPinkerton, Debra Entered By: Alejandro MullingPinkerton, Debra on 04/21/2017 13:27:36 Brian Ewing, Brian W.  ([id number]) -------------------------------------------------------------------------------- Fall Risk Assessment Details Patient Name: Brian Ewing, Bryne W. Date of Service: 04/21/2017 1:30 PM Medical Record Number: [id number] Patient Account Number: [id number] Date of Birth/Sex: 02/08/1961 (56 y.o. Male) Treating RN: Phillis HaggisPinkerton, Debi Primary Care Earlean Fidalgo: Ardyth GalLUKING, WILLIAM Other Clinician: Referring Ralph Brouwer: Ardyth GalLUKING, WILLIAM Treating Marjarie Irion/Extender: Rudene ReBritto, Errol Weeks in Treatment: 0 Fall Risk Assessment Items Have you had 2 or more falls in the last 12 monthso 0 No Have you had any fall that resulted in injury in the last 12 monthso 0 No FALL RISK ASSESSMENT: History of falling - immediate or within 3 months 0 No Secondary diagnosis 0 No Ambulatory aid None/bed rest/wheelchair/nurse 0 No Crutches/cane/walker 0 No  Furniture 0 No IV Access/Saline Lock 0 No Gait/Training Normal/bed rest/immobile 0 No Weak 0 No Impaired 0 No Mental Status Oriented to own ability 0 Yes Electronic Signature(s) Signed: 04/21/2017 4:39:14 PM By: Alejandro Mulling Entered By: Alejandro Mulling on 04/21/2017 13:27:52 Brian Natal ([id number]) -------------------------------------------------------------------------------- Foot Assessment Details Patient Name: Brian Natal Date of Service: 04/21/2017 1:30 PM Medical Record Number: [id number] Patient Account Number: [id number] Date of Birth/Sex: 13-Feb-1961 (56 y.o. Male) Treating RN: Phillis Haggis Primary Care Crimson Beer: Ardyth Gal Other Clinician: Referring Roza Creamer: Ardyth Gal Treating Sarha Bartelt/Extender: Rudene Re in Treatment: 0 Foot Assessment Items Site Locations + = Sensation present, - = Sensation absent, C = Callus, U = Ulcer R = Redness, W = Warmth, M = Maceration, PU = Pre-ulcerative lesion F = Fissure, S = Swelling, D = Dryness Assessment Right: Left: Other Deformity: No No Prior Foot Ulcer: No No Prior  Amputation: No No Charcot Joint: No No Ambulatory Status: Ambulatory Without Help Gait: Steady Electronic Signature(s) Signed: 04/21/2017 4:39:14 PM By: Alejandro Mulling Entered By: Alejandro Mulling on 04/21/2017 13:30:32 Brian Natal ([id number]) -------------------------------------------------------------------------------- Nutrition Risk Assessment Details Patient Name: Brian Natal Date of Service: 04/21/2017 1:30 PM Medical Record Number: [id number] Patient Account Number: [id number] Date of Birth/Sex: 09/10/1961 (56 y.o. Male) Treating RN: Phillis Haggis Primary Care Jenesis Suchy: Ardyth Gal Other Clinician: Referring Irwin Toran: Ardyth Gal Treating Spenser Cong/Extender: Rudene Re in Treatment: 0 Height (in): 73 Weight (lbs): 203 Body Mass Index (BMI): 26.8 Nutrition Risk Assessment Items NUTRITION RISK SCREEN: I have an illness or condition that made me change the kind and/or 0 No amount of food I eat I eat fewer than two meals per day 0 No I eat few fruits and vegetables, or milk products 0 No I have three or more drinks of beer, liquor or wine almost every day 0 No I have tooth or mouth problems that make it hard for me to eat 0 No I don't always have enough money to buy the food I need 0 No I eat alone most of the time 0 No I take three or more different prescribed or over-the-counter drugs a 1 Yes day Without wanting to, I have lost or gained 10 pounds in the last six 0 No months I am not always physically able to shop, cook and/or feed myself 0 No Nutrition Protocols Good Risk Protocol 0 No interventions needed Moderate Risk Protocol Electronic Signature(s) Signed: 04/21/2017 4:39:14 PM By: Alejandro Mulling Entered By: Alejandro Mulling on 04/21/2017 13:28:08

## 2017-04-28 ENCOUNTER — Encounter: Payer: Managed Care, Other (non HMO) | Admitting: Surgery

## 2017-04-28 DIAGNOSIS — E11621 Type 2 diabetes mellitus with foot ulcer: Secondary | ICD-10-CM | POA: Diagnosis not present

## 2017-04-28 NOTE — Progress Notes (Signed)
Brian Ewing, Brian Ewing ([id number]) Visit Report for 04/28/2017 Arrival Information Details Patient Name: Brian Ewing, Brian Ewing. Date of Service: 04/28/2017 2:30 PM Medical Record Number: [id number] Patient Account Number: [id number] Date of Birth/Sex: [birth year] (55 y.o. Male) Treating RN: Phillis Haggis Primary Care Inocencio Roy: Ardyth Gal Other Clinician: Referring Neeta Storey: Ardyth Gal Treating Kingsly Kloepfer/Extender: Rudene Re in Treatment: 1 Visit Information History Since Last Visit All ordered tests and consults were completed: No Patient Arrived: Ambulatory Added or deleted any medications: No Arrival Time: 14:31 Any new allergies or adverse reactions: No Accompanied By: self Had a fall or experienced change in No Transfer Assistance: None activities of daily living that may affect Patient Identification Verified: Yes risk of falls: Secondary Verification Process Yes Signs or symptoms of abuse/neglect since last No Completed: visito Patient Requires Transmission- No Hospitalized since last visit: No Based Precautions: Has Dressing in Place as Prescribed: Yes Patient Has Alerts: Yes Pain Present Now: No Patient Alerts: DM II (Diet Controlled) Electronic Signature(s) Signed: 04/28/2017 3:47:26 PM By: Alejandro Mulling Entered By: Alejandro Mulling on 04/28/2017 14:33:18 Brian Ewing ([id number]) -------------------------------------------------------------------------------- Clinic Level of Care Assessment Details Patient Name: Brian Ewing Date of Service: 04/28/2017 2:30 PM Medical Record Number: [id number] Patient Account Number: [id number] Date of Birth/Sex: 11-Apr-1961 (55 y.o. Male) Treating RN: Phillis Haggis Primary Care Rusell Meneely: Ardyth Gal Other Clinician: Referring Omeka Holben: Ardyth Gal Treating Safiatou Islam/Extender: Rudene Re in Treatment: 1 Clinic Level of Care Assessment Items TOOL 4 Quantity Score X - Use when only an EandM  is performed on FOLLOW-UP visit 1 0 ASSESSMENTS - Nursing Assessment / Reassessment X - Reassessment of Co-morbidities (includes updates in patient status) 1 10 X - Reassessment of Adherence to Treatment Plan 1 5 ASSESSMENTS - Wound and Skin Assessment / Reassessment X - Simple Wound Assessment / Reassessment - one wound 1 5 []  - Complex Wound Assessment / Reassessment - multiple wounds 0 []  - Dermatologic / Skin Assessment (not related to wound area) 0 ASSESSMENTS - Focused Assessment []  - Circumferential Edema Measurements - multi extremities 0 []  - Nutritional Assessment / Counseling / Intervention 0 []  - Lower Extremity Assessment (monofilament, tuning fork, pulses) 0 []  - Peripheral Arterial Disease Assessment (using hand held doppler) 0 ASSESSMENTS - Ostomy and/or Continence Assessment and Care []  - Incontinence Assessment and Management 0 []  - Ostomy Care Assessment and Management (repouching, etc.) 0 PROCESS - Coordination of Care X - Simple Patient / Family Education for ongoing care 1 15 []  - Complex (extensive) Patient / Family Education for ongoing care 0 []  - Staff obtains Chiropractor, Records, Test Results / Process Orders 0 []  - Staff telephones HHA, Nursing Homes / Clarify orders / etc 0 []  - Routine Transfer to another Facility (non-emergent condition) 0 HORTON, Brian Ewing ([id number]) []  - Routine Hospital Admission (non-emergent condition) 0 []  - New Admissions / Manufacturing engineer / Ordering NPWT, Apligraf, etc. 0 []  - Emergency Hospital Admission (emergent condition) 0 X - Simple Discharge Coordination 1 10 []  - Complex (extensive) Discharge Coordination 0 PROCESS - Special Needs []  - Pediatric / Minor Patient Management 0 []  - Isolation Patient Management 0 []  - Hearing / Language / Visual special needs 0 []  - Assessment of Community assistance (transportation, D/C planning, etc.) 0 []  - Additional assistance / Altered mentation 0 []  - Support Surface(s)  Assessment (bed, cushion, seat, etc.) 0 INTERVENTIONS - Wound Cleansing / Measurement X - Simple Wound Cleansing - one wound 1 5 []  - Complex Wound Cleansing - multiple wounds  0 X - Wound Imaging (photographs - any number of wounds) 1 5 []  - Wound Tracing (instead of photographs) 0 []  - Simple Wound Measurement - one wound 0 []  - Complex Wound Measurement - multiple wounds 0 INTERVENTIONS - Wound Dressings []  - Small Wound Dressing one or multiple wounds 0 []  - Medium Wound Dressing one or multiple wounds 0 []  - Large Wound Dressing one or multiple wounds 0 []  - Application of Medications - topical 0 []  - Application of Medications - injection 0 INTERVENTIONS - Miscellaneous []  - External ear exam 0 Brian NatalJOHNSON, Brian W. ([id number]) []  - Specimen Collection (cultures, biopsies, blood, body fluids, etc.) 0 []  - Specimen(s) / Culture(s) sent or taken to Lab for analysis 0 []  - Patient Transfer (multiple staff / Michiel SitesHoyer Lift / Similar devices) 0 []  - Simple Staple / Suture removal (25 or less) 0 []  - Complex Staple / Suture removal (26 or more) 0 []  - Hypo / Hyperglycemic Management (close monitor of Blood Glucose) 0 []  - Ankle / Brachial Index (ABI) - do not check if billed separately 0 X - Vital Signs 1 5 Has the patient been seen at the hospital within the last three years: Yes Total Score: 60 Level Of Care: New/Established - Level 2 Electronic Signature(s) Signed: 04/28/2017 3:47:26 PM By: Alejandro MullingPinkerton, Debra Entered By: Alejandro MullingPinkerton, Debra on 04/28/2017 15:21:50 Brian NatalJOHNSON, Brian W. ([id number]) -------------------------------------------------------------------------------- Encounter Discharge Information Details Patient Name: Brian NatalJOHNSON, Brian W. Date of Service: 04/28/2017 2:30 PM Medical Record Number: [id number] Patient Account Number: [id number] Date of Birth/Sex: 1961-05-01 (55 y.o. Male) Treating RN: Phillis HaggisPinkerton, Debi Primary Care Reagen Goates: Ardyth GalLUKING, WILLIAM Other Clinician: Referring  Pearl Bents: Ardyth GalLUKING, WILLIAM Treating Waldemar Siegel/Extender: Rudene ReBritto, Errol Weeks in Treatment: 1 Encounter Discharge Information Items Discharge Pain Level: 0 Discharge Condition: Stable Ambulatory Status: Ambulatory Discharge Destination: Home Transportation: Private Auto Accompanied By: self Schedule Follow-up Appointment: No Medication Reconciliation completed and provided to Patient/Care No Denesha Brouse: Provided on Clinical Summary of Care: 04/28/2017 Form Type Recipient Paper Patient Ellard ArtisLJ Electronic Signature(s) Signed: 04/28/2017 2:58:02 PM By: Gwenlyn PerkingMoore, Shelia Entered By: Gwenlyn PerkingMoore, Shelia on 04/28/2017 14:58:01 Brian NatalJOHNSON, Brian W. ([id number]) -------------------------------------------------------------------------------- Lower Extremity Assessment Details Patient Name: Brian NatalJOHNSON, Brian W. Date of Service: 04/28/2017 2:30 PM Medical Record Number: [id number] Patient Account Number: [id number] Date of Birth/Sex: 1961-05-01 (55 y.o. Male) Treating RN: Phillis HaggisPinkerton, Debi Primary Care Yanci Bachtell: Ardyth GalLUKING, WILLIAM Other Clinician: Referring Karna Abed: Ardyth GalLUKING, WILLIAM Treating Riggs Dineen/Extender: Rudene ReBritto, Errol Weeks in Treatment: 1 Vascular Assessment Pulses: Dorsalis Pedis Palpable: [Left:Yes] Posterior Tibial Extremity colors, hair growth, and conditions: Extremity Color: [Left:Normal] Temperature of Extremity: [Left:Warm] Capillary Refill: [Left:< 3 seconds] Toe Nail Assessment Left: Right: Thick: No Discolored: No Deformed: No Improper Length and Hygiene: No Electronic Signature(s) Signed: 04/28/2017 3:47:26 PM By: Alejandro MullingPinkerton, Debra Entered By: Alejandro MullingPinkerton, Debra on 04/28/2017 14:38:46 Brian NatalJOHNSON, Brian W. ([id number]) -------------------------------------------------------------------------------- Multi Wound Chart Details Patient Name: Brian NatalJOHNSON, Brian W. Date of Service: 04/28/2017 2:30 PM Medical Record Number: [id number] Patient Account Number: [id number] Date of Birth/Sex: 1961-05-01 (55 y.o.  Male) Treating RN: Phillis HaggisPinkerton, Debi Primary Care Aldina Porta: Ardyth GalLUKING, WILLIAM Other Clinician: Referring Allice Garro: Ardyth GalLUKING, WILLIAM Treating Illene Sweeting/Extender: Rudene ReBritto, Errol Weeks in Treatment: 1 Vital Signs Height(in): 73 Pulse(bpm): 70 Weight(lbs): 203 Blood Pressure 120/79 (mmHg): Body Mass Index(BMI): 27 Temperature(F): 98.4 Respiratory Rate 20 (breaths/min): Photos: [1:No Photos] [N/A:N/A] Wound Location: [1:Left Malleolus - Medial] [N/A:N/A] Wounding Event: [1:Gradually Appeared] [N/A:N/A] Primary Etiology: [1:Atypical] [N/A:N/A] Comorbid History: [1:Hypertension, Type II Diabetes, Osteoarthritis] [N/A:N/A] Date Acquired: [1:04/07/2017] [N/A:N/A] Weeks of Treatment: [1:1] [N/A:N/A] Wound Status: [1:Healed - Epithelialized] [N/A:N/A] Measurements  L x W x D 0x0x0 [N/A:N/A] (cm) Area (cm) : [1:0] [N/A:N/A] Volume (cm) : [1:0] [N/A:N/A] % Reduction in Area: [1:100.00%] [N/A:N/A] % Reduction in Volume: 100.00% [N/A:N/A] Classification: [1:Partial Thickness] [N/A:N/A] HBO Classification: [1:Grade 1] [N/A:N/A] Exudate Amount: [1:None Present] [N/A:N/A] Wound Margin: [1:Flat and Intact] [N/A:N/A] Granulation Amount: [1:None Present (0%)] [N/A:N/A] Necrotic Amount: [1:None Present (0%)] [N/A:N/A] Epithelialization: [1:Large (67-100%)] [N/A:N/A] Periwound Skin Texture: No Abnormalities Noted [N/A:N/A] Periwound Skin [1:No Abnormalities Noted] [N/A:N/A] Moisture: Periwound Skin Color: No Abnormalities Noted [N/A:N/A] Temperature: [1:No Abnormality] [N/A:N/A] Tenderness on [1:Yes] [N/A:N/A] Palpation: Brian Ewing, Brian Ewing ([id number]) Wound Preparation: Ulcer Cleansing: N/A N/A Rinsed/Irrigated with Saline Topical Anesthetic Applied: None Treatment Notes Electronic Signature(s) Signed: 04/28/2017 2:59:23 PM By: Evlyn Kanner MD, FACS Entered By: Evlyn Kanner on 04/28/2017 14:59:23 Brian Ewing  ([id number]) -------------------------------------------------------------------------------- Multi-Disciplinary Care Plan Details Patient Name: Brian Ewing Date of Service: 04/28/2017 2:30 PM Medical Record Number: [id number] Patient Account Number: [id number] Date of Birth/Sex: January 29, 1961 (55 y.o. Male) Treating RN: Phillis Haggis Primary Care Denny Lave: Ardyth Gal Other Clinician: Referring Anshika Pethtel: Ardyth Gal Treating Omir Cooprider/Extender: Rudene Re in Treatment: 1 Active Inactive Electronic Signature(s) Signed: 04/28/2017 3:47:26 PM By: Alejandro Mulling Entered By: Alejandro Mulling on 04/28/2017 14:50:13 Brian Ewing ([id number]) -------------------------------------------------------------------------------- Pain Assessment Details Patient Name: Brian Ewing Date of Service: 04/28/2017 2:30 PM Medical Record Number: [id number] Patient Account Number: [id number] Date of Birth/Sex: July 09, 1961 (55 y.o. Male) Treating RN: Phillis Haggis Primary Care Brian Dollar: Ardyth Gal Other Clinician: Referring Ameia Morency: Ardyth Gal Treating Regino Fournet/Extender: Rudene Re in Treatment: 1 Active Problems Location of Pain Severity and Description of Pain Patient Has Paino No Site Locations With Dressing Change: No Pain Management and Medication Current Pain Management: Electronic Signature(s) Signed: 04/28/2017 3:47:26 PM By: Alejandro Mulling Entered By: Alejandro Mulling on 04/28/2017 14:33:25 Brian Ewing ([id number]) -------------------------------------------------------------------------------- Patient/Caregiver Education Details Patient Name: Brian Ewing Date of Service: 04/28/2017 2:30 PM Medical Record Number: [id number] Patient Account Number: [id number] Date of Birth/Gender: 1961/01/23 (55 y.o. Male) Treating RN: Phillis Haggis Primary Care Physician: Ardyth Gal Other Clinician: Referring Physician: Ardyth Gal Treating Physician/Extender: Rudene Re in Treatment: 1 Education Assessment Education Provided To: Patient Education Topics Provided Wound/Skin Impairment: Other: Please keep area clean and dry. Please call our office if you have any questions or Handouts: concerns. Methods: Explain/Verbal Responses: State content correctly Electronic Signature(s) Signed: 04/28/2017 3:47:26 PM By: Alejandro Mulling Entered By: Alejandro Mulling on 04/28/2017 14:51:25 Brian Ewing ([id number]) -------------------------------------------------------------------------------- Wound Assessment Details Patient Name: Brian Ewing Date of Service: 04/28/2017 2:30 PM Medical Record Number: [id number] Patient Account Number: [id number] Date of Birth/Sex: 12-20-1960 (55 y.o. Male) Treating RN: Phillis Haggis Primary Care Senaya Dicenso: Ardyth Gal Other Clinician: Referring Aylan Bayona: Ardyth Gal Treating Sienna Stonehocker/Extender: Rudene Re in Treatment: 1 Wound Status Wound Number: 1 Primary Atypical Etiology: Wound Location: Left Malleolus - Medial Wound Status: Healed - Epithelialized Wounding Event: Gradually Appeared Comorbid Hypertension, Type II Diabetes, Date Acquired: 04/07/2017 History: Osteoarthritis Weeks Of Treatment: 1 Clustered Wound: No Photos Photo Uploaded By: Alejandro Mulling on 04/28/2017 15:38:07 Wound Measurements Length: (cm) 0 % Reduction Width: (cm) 0 % Reduction Depth: (cm) 0 Epitheliali Area: (cm) 0 Tunneling: Volume: (cm) 0 Underminin in Area: 100% in Volume: 100% zation: Large (67-100%) No g: No Wound Description Classification: Partial Thickness Foul Odor A Diabetic Severity (Wagner): Grade 1 Slough/Fibr Wound Margin: Flat and Intact Exudate Amount: None Present fter Cleansing: No ino No Wound Bed Granulation Amount: None Present (0%) Necrotic Amount: None  Present (0%) Periwound Skin Texture Texture Color No  Abnormalities Noted: No No Abnormalities Noted: No Brian Ewing, Brian Ewing ([id number]) Moisture Temperature / Pain No Abnormalities Noted: No Temperature: No Abnormality Tenderness on Palpation: Yes Wound Preparation Ulcer Cleansing: Rinsed/Irrigated with Saline Topical Anesthetic Applied: None Electronic Signature(s) Signed: 04/28/2017 3:47:26 PM By: Alejandro Mulling Entered By: Alejandro Mulling on 04/28/2017 14:49:48 Brian Ewing ([id number]) -------------------------------------------------------------------------------- Vitals Details Patient Name: Brian Ewing Date of Service: 04/28/2017 2:30 PM Medical Record Number: [id number] Patient Account Number: [id number] Date of Birth/Sex: 08/15/1961 (55 y.o. Male) Treating RN: Phillis Haggis Primary Care Sherrise Liberto: Ardyth Gal Other Clinician: Referring Braelynn Lupton: Ardyth Gal Treating Jasmene Goswami/Extender: Rudene Re in Treatment: 1 Vital Signs Time Taken: 14:33 Temperature (F): 98.4 Height (in): 73 Pulse (bpm): 70 Weight (lbs): 203 Respiratory Rate (breaths/min): 20 Body Mass Index (BMI): 26.8 Blood Pressure (mmHg): 120/79 Reference Range: 80 - 120 mg / dl Electronic Signature(s) Signed: 04/28/2017 3:47:26 PM By: Alejandro Mulling Entered By: Alejandro Mulling on 04/28/2017 14:33:52

## 2017-04-28 NOTE — Progress Notes (Signed)
CALEEL, KINER ([id number]) Visit Report for 04/28/2017 Chief Complaint Document Details Patient Name: Brian Ewing, Brian Ewing. Date of Service: 04/28/2017 2:30 PM Medical Record Number: [id number] Patient Account Number: [id number] Date of Birth/Sex: Jul 28, 1961 (56 y.o. Male) Treating RN: Phillis Haggis Primary Care Provider: Ardyth Gal Other Clinician: Referring Provider: Ardyth Gal Treating Provider/Extender: Rudene Re in Treatment: 1 Information Obtained from: Patient Chief Complaint Patients presents for treatment of an open diabetic ulcer to the left ankle which she's had for about 2 weeks Electronic Signature(s) Signed: 04/28/2017 3:00:13 PM By: Evlyn Kanner MD, FACS Entered By: Evlyn Kanner on 04/28/2017 15:00:12 Brian Ewing ([id number]) -------------------------------------------------------------------------------- HPI Details Patient Name: Brian Ewing Date of Service: 04/28/2017 2:30 PM Medical Record Number: [id number] Patient Account Number: [id number] Date of Birth/Sex: 07-Apr-1961 (56 y.o. Male) Treating RN: Phillis Haggis Primary Care Provider: Ardyth Gal Other Clinician: Referring Provider: Ardyth Gal Treating Provider/Extender: Rudene Re in Treatment: 1 History of Present Illness Location: Patient presents with an ulcer on the left medial ankle Quality: Patient reports No Pain. Severity: Patient states wound (s) are getting better. Duration: Patient has had the wound for < 2 weeks prior to presenting for treatment Context: The wound appeared gradually over time Modifying Factors: Other treatment(s) tried include: has been taking doxycycline and applying Bactroban Associated Signs and Symptoms: Patient reports having increase swelling. HPI Description: 56 year old patient presented to his PCP earlier this month for a possible bite to his left ankle which has been growing bigger over 2 weeks. he was diagnosed to have a  staphylococcal bullous and Bactroban was applied locally and doxycycline was given. He has been treated in the past for hypertension and diabetes mellitus and his last hemoglobin A1c was 5.6%. He has never been a smoker. Electronic Signature(s) Signed: 04/28/2017 3:00:25 PM By: Evlyn Kanner MD, FACS Entered By: Evlyn Kanner on 04/28/2017 15:00:24 Brian Ewing ([id number]) -------------------------------------------------------------------------------- Physical Exam Details Patient Name: Brian Ewing Date of Service: 04/28/2017 2:30 PM Medical Record Number: [id number] Patient Account Number: [id number] Date of Birth/Sex: 06/13/1961 (56 y.o. Male) Treating RN: Phillis Haggis Primary Care Provider: Ardyth Gal Other Clinician: Referring Provider: Ardyth Gal Treating Provider/Extender: Rudene Re in Treatment: 1 Constitutional . Pulse regular. Respirations normal and unlabored. Afebrile. . Eyes Nonicteric. Reactive to light. Ears, Nose, Mouth, and Throat Lips, teeth, and gums WNL.Marland Kitchen Moist mucosa without lesions. Neck supple and nontender. No palpable supraclavicular or cervical adenopathy. Normal sized without goiter. Respiratory WNL. No retractions.. Breath sounds WNL, No rubs, rales, rhonchi, or wheeze.. Cardiovascular Pedal Pulses WNL. No clubbing, cyanosis or edema. Lymphatic No adneopathy. No adenopathy. No adenopathy. Musculoskeletal Adexa without tenderness or enlargement.. Digits and nails w/o clubbing, cyanosis, infection, petechiae, ischemia, or inflammatory conditions.. Integumentary (Hair, Skin) No suspicious lesions. No crepitus or fluctuance. No peri-wound warmth or erythema. No masses.Marland Kitchen Psychiatric Judgement and insight Intact.. No evidence of depression, anxiety, or agitation.. Notes the circular area of skin has now epithelialized nicely and some debris was removed and there is no open ulceration. The wound is healed. Electronic  Signature(s) Signed: 04/28/2017 3:01:14 PM By: Evlyn Kanner MD, FACS Entered By: Evlyn Kanner on 04/28/2017 15:01:14 Brian Ewing ([id number]) -------------------------------------------------------------------------------- Physician Orders Details Patient Name: Brian Ewing Date of Service: 04/28/2017 2:30 PM Medical Record Number: [id number] Patient Account Number: [id number] Date of Birth/Sex: [birth year] (56 y.o. Male) Treating RN: Phillis Haggis Primary Care Provider: Ardyth Gal Other Clinician: Referring Provider: Ardyth Gal Treating Provider/Extender: Rudene Re in Treatment: 1 Verbal /  Phone Orders: Yes Clinician: Ashok CordiaPinkerton, Debi Read Back and Verified: Yes Diagnosis Coding [facility] o Discharge from Wound Care Center - Please keep area clean and dry. Please call our office if you have any questions or concerns. Electronic Signature(s) Signed: 04/28/2017 3:10:48 PM By: Evlyn KannerBritto, Janalyn Higby MD, FACS Signed: 04/28/2017 3:47:26 PM By: Alejandro MullingPinkerton, Debra Entered By: Alejandro MullingPinkerton, Debra on 04/28/2017 14:50:56 Brian NatalJOHNSON, Brian W. ([id number]) -------------------------------------------------------------------------------- Problem List Details Patient Name: Brian NatalJOHNSON, Brian W. Date of Service: 04/28/2017 2:30 PM Medical Record Number: [id number] Patient Account Number: [id number] Date of Birth/Sex: Feb 06, 1961 (56 y.o. 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 ([id number]) -------------------------------------------------------------------------------- Progress Note Details Patient Name: Brian NatalJOHNSON, Brian W. Date of Service: 04/28/2017 2:30 PM Medical Record Number: [id number] Patient Account Number: [id number] Date of Birth/Sex: Feb 06, 1961 [age] y.o. Male) Treating RN: Phillis HaggisPinkerton, Debi Primary Care Provider: Ardyth GalLUKING, WILLIAM Other Clinician: Referring Provider: Ardyth GalLUKING, WILLIAM Treating Provider/Extender: Rudene ReBritto, Eleora Sutherland Weeks in Treatment: 1 Subjective Chief Complaint Information obtained from Patient Patients presents for treatment of an open diabetic ulcer to the left ankle which she's had for about 2 weeks History of Present Illness (HPI) The following HPI elements were documented for the patient's wound: Location: Patient presents with an ulcer on the left medial ankle Quality: Patient reports No Pain. Severity: Patient states wound (s) are getting better. Duration: Patient has had the wound for < 2 weeks prior to presenting for treatment Context: The wound appeared gradually over time Modifying Factors: Other treatment(s) tried include: has been taking doxycycline and applying Bactroban Associated Signs and Symptoms: Patient reports having increase swelling. 56 year old patient presented to his PCP earlier this month for a possible bite to his left ankle which has been growing bigger over 2 weeks. he was diagnosed to have a staphylococcal bullous and Bactroban was applied locally and doxycycline was given. He has been treated in the past for hypertension and diabetes mellitus and his last hemoglobin A1c was 5.6%. He has never been a smoker. Objective Constitutional Pulse regular. Respirations normal and unlabored. Afebrile. Vitals Time Taken: 2:33 PM, Height: 73 in, Weight: 203 lbs, BMI: 26.8, Temperature: 98.4 F, Pulse: 70 bpm, Respiratory Rate: 20 breaths/min, Blood  Pressure: 120/79 mmHg. Eyes Nonicteric. Reactive to light. Brian NatalJOHNSON, Brian W. ([id number]) Ears, Nose, Mouth, and Throat Lips, teeth, and gums WNL.Marland Kitchen. Moist mucosa without lesions. Neck supple and nontender. No palpable supraclavicular or cervical adenopathy. Normal sized without goiter. Respiratory WNL. No retractions.. Breath sounds WNL, No rubs, rales, rhonchi, or wheeze.. Cardiovascular Pedal Pulses WNL. No clubbing, cyanosis or edema. Lymphatic No adneopathy. No adenopathy. No adenopathy. Musculoskeletal Adexa without tenderness or enlargement.. Digits and nails w/o clubbing, cyanosis, infection, petechiae, ischemia, or inflammatory conditions.Marland Kitchen. Psychiatric Judgement and insight Intact.. No evidence of depression, anxiety, or agitation.. General Notes: the circular area of skin has now epithelialized nicely and some debris was removed and there is no open ulceration. The wound is healed. Integumentary (Hair, Skin) No suspicious lesions. No crepitus or fluctuance. No peri-wound warmth or erythema. No masses.. Wound #1 status is Healed - Epithelialized. Original cause of wound  was Gradually Appeared. The wound is located on the Left,Medial Malleolus. The wound measures 0cm length x 0cm width x 0cm depth; 0cm^2 area and 0cm^3 volume. There is no tunneling or undermining noted. There is a none present amount of drainage noted. The wound margin is flat and intact. There is no granulation within the wound bed. There is no necrotic tissue within the wound bed. Periwound temperature was noted as No Abnormality. The periwound has tenderness on palpation. 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 Please call our office if you have  any questions or concerns. The wound is healed nicely and there is no open wound. I have asked him to protect this area for the next week and he can call us if there is still drainage or an ulceration reappears. Other than that he is discharged from the wound care services today and will be seen back only if needed Electronic Signature(s) Signed: 04/28/2017 3:02:40 PM By: Evlyn Kanner MD, FACS Entered By: Evlyn Kanner on 04/28/2017 15:02:40 Brian Ewing ([id number]) -------------------------------------------------------------------------------- SuperBill Details Patient Name: Brian Ewing Date of Service: 04/28/2017 Medical Record Number: [id number] Patient Account Number: [id number] Date of Birth/Sex: 1960/12/13 (56 y.o. 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 Facility Procedures CPT4 Code: [id number] Description: 84132 - WOUND CARE VISIT-LEV 2 EST PT Modifier: Quantity: 1 Physician Procedures CPT4 Code Description: [id number] 25366 - WC PHYS LEVEL 2 - EST PT ICD-10 Description Diagnosis E11.621 Type 2 diabetes mellitus 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

## 2017-05-29 ENCOUNTER — Other Ambulatory Visit: Payer: Self-pay | Admitting: Family Medicine

## 2017-07-15 ENCOUNTER — Encounter: Payer: Self-pay | Admitting: Family Medicine

## 2017-07-15 ENCOUNTER — Ambulatory Visit (INDEPENDENT_AMBULATORY_CARE_PROVIDER_SITE_OTHER): Payer: Managed Care, Other (non HMO) | Admitting: Family Medicine

## 2017-07-15 ENCOUNTER — Ambulatory Visit (HOSPITAL_COMMUNITY)
Admission: RE | Admit: 2017-07-15 | Discharge: 2017-07-15 | Disposition: A | Discharge status: Home / Self Care | Payer: Managed Care, Other (non HMO) | Source: Ambulatory Visit | Attending: Family Medicine | Admitting: Family Medicine

## 2017-07-15 VITALS — BP 114/64 | Ht 73.0 in | Wt 202.4 lb

## 2017-07-15 DIAGNOSIS — M79671 Pain in right foot: Secondary | ICD-10-CM | POA: Diagnosis not present

## 2017-07-15 NOTE — Progress Notes (Signed)
   Subjective:    Patient ID: Brian Ewing, male    DOB: [birth year], 56 y.o.   MRN: [id number]  Foot Pain  This is a new problem. The current episode started in the past 7 days. The symptoms are aggravated by walking. He has tried NSAIDs for the symptoms.   Patient dropped large object on foot. Notes substantial pain. Some swelling. Notes a bit of limp when walking.   Patient states no other concerns this visit.   Review of Systems No headache, no major weight loss or weight gain, no chest pain no back pain abdominal pain no change in bowel habits complete ROS otherwise negative     Objective:   Physical Exam  Alert vitals stable, NAD. Blood pressure good on repeat. HEENT normal. Lungs clear. Heart regular rate and rhythm. Foot positive pain tenderness inflammation at interphalangeal joint of great toe      Assessment & Plan:  Impression foot contusion. X-ray revealed no fracture. Symptom care encourage. Work excuse written at patient request

## 2017-07-16 ENCOUNTER — Other Ambulatory Visit: Payer: Self-pay | Admitting: *Deleted

## 2017-07-16 ENCOUNTER — Encounter: Payer: Self-pay | Admitting: Family Medicine

## 2017-07-16 MED ORDER — HYDROCODONE-ACETAMINOPHEN 5-325 MG PO TABS: 1.0000 | ORAL_TABLET | Freq: Four times a day (QID) | ORAL | 0 refills | Status: DC | PRN

## 2017-07-18 ENCOUNTER — Ambulatory Visit: Payer: Managed Care, Other (non HMO) | Admitting: Family Medicine

## 2017-07-25 ENCOUNTER — Ambulatory Visit (INDEPENDENT_AMBULATORY_CARE_PROVIDER_SITE_OTHER): Payer: Managed Care, Other (non HMO) | Admitting: Family Medicine

## 2017-07-25 ENCOUNTER — Encounter: Payer: Self-pay | Admitting: Family Medicine

## 2017-07-25 VITALS — BP 110/76 | Ht 73.0 in | Wt 203.0 lb

## 2017-07-25 DIAGNOSIS — E119 Type 2 diabetes mellitus without complications: Secondary | ICD-10-CM

## 2017-07-25 DIAGNOSIS — Z23 Encounter for immunization: Secondary | ICD-10-CM

## 2017-07-25 DIAGNOSIS — I10 Essential (primary) hypertension: Secondary | ICD-10-CM | POA: Diagnosis not present

## 2017-07-25 DIAGNOSIS — E785 Hyperlipidemia, unspecified: Secondary | ICD-10-CM | POA: Diagnosis not present

## 2017-07-25 LAB — POCT GLYCOSYLATED HEMOGLOBIN (HGB A1C): Hemoglobin A1C: 5.1

## 2017-07-25 MED ORDER — AMLODIPINE BESY-BENAZEPRIL HCL 10-20 MG PO CAPS: 1.0000 | ORAL_CAPSULE | Freq: Every day | ORAL | 1 refills | Status: DC

## 2017-07-25 MED ORDER — HYDROCHLOROTHIAZIDE 25 MG PO TABS: 25.0000 mg | ORAL_TABLET | Freq: Every day | ORAL | 1 refills | Status: DC

## 2017-07-25 MED ORDER — PRAVASTATIN SODIUM 80 MG PO TABS: 80.0000 mg | ORAL_TABLET | Freq: Every evening | ORAL | 1 refills | Status: DC

## 2017-07-25 NOTE — Progress Notes (Signed)
Subjective:    Patient ID: Brian Ewing, male    DOB: 11-08-1961, 56 y.o.   MRN: [id number]  Hypertension  This is a chronic problem. The current episode started more than 1 year ago.   Patient states no other concerns this visit.   Results for orders placed or performed in visit on 01/14/17  Lipid panel  Result Value Ref Range   Cholesterol, Total 122 100 - 199 mg/dL   Triglycerides 26 0 - 149 mg/dL   HDL 54 >40 mg/dL   VLDL Cholesterol Cal 5 5 - 40 mg/dL   LDL Calculated 63 0 - 99 mg/dL   Chol/HDL Ratio 2.3 0.0 - 5.0 ratio units  Hepatic function panel  Result Value Ref Range   Total Protein 7.2 6.0 - 8.5 g/dL   Albumin 4.5 3.5 - 5.5 g/dL   Bilirubin Total 0.7 0.0 - 1.2 mg/dL   Bilirubin, Direct 9.81 0.00 - 0.40 mg/dL   Alkaline Phosphatase 51 39 - 117 IU/L   AST 40 0 - 40 IU/L   ALT 38 0 - 44 IU/L  Basic metabolic panel  Result Value Ref Range   Glucose 90 65 - 99 mg/dL   BUN 21 6 - 24 mg/dL   Creatinine, Ser 1.91 0.76 - 1.27 mg/dL   GFR calc non Af Amer 67 >59 mL/min/1.73   GFR calc Af Amer 77 >59 mL/min/1.73   BUN/Creatinine Ratio 17 9 - 20   Sodium 141 134 - 144 mmol/L   Potassium 3.7 3.5 - 5.2 mmol/L   Chloride 99 96 - 106 mmol/L   CO2 26 18 - 29 mmol/L   Calcium 9.4 8.7 - 10.2 mg/dL  Hemoglobin Y7W  Result Value Ref Range   Hgb A1c MFr Bld 5.6 4.8 - 5.6 %   Est. average glucose Bld gHb Est-mCnc 114 mg/dL  PSA  Result Value Ref Range   Prostate Specific Ag, Serum 0.4 0.0 - 4.0 ng/mL   Patient claims compliance with diabetes medication. No obvious side effects. Reports no substantial low sugar spells. Most numbers are generally in good range when checked fasting. Generally does not miss a dose of medication. Watching diabetic diet closely  Blood pressure medicine and blood pressure levels reviewed today with patient. Compliant with blood pressure medicine. States does not miss a dose. No obvious side effects. Blood pressure generally good when checked  elsewhere. Watching salt intake.   Patient continues to take lipid medication regularly. No obvious side effects from it. Generally does not miss a dose. Prior blood work results are reviewed with patient. Patient continues to work on fat intake in diet   Review of Systems No headache, no major weight loss or weight gain, no chest pain no back pain abdominal pain no change in bowel habits complete ROS otherwise negative     Objective:   Physical Exam  Alert and oriented, vitals reviewed and stable, NAD ENT-TM's and ext canals WNL bilat via otoscopic exam Soft palate, tonsils and post pharynx WNL via oropharyngeal exam Neck-symmetric, no masses; thyroid nonpalpable and nontender Pulmonary-no tachypnea or accessory muscle use; Clear without wheezes via auscultation Card--no abnrml murmurs, rhythm reg and rate WNL Carotid pulses symmetric, without bruits       Assessment & Plan:  Impression 1 hypertension discussed good control maintain same meds  #2 hyperlipidemia. Prior blood work reviewed discussed maintain same pending new blood work.  #3 type 2 diabetes A1c that reviewed encouraged continue see Dr. regular  Plan  flu shot. Medications refilled. Diet exercise discussed. Appropriate blood work further recommendations based results

## 2017-08-06 LAB — LIPID PANEL
CHOLESTEROL TOTAL: 125 mg/dL (ref 100–199)
Chol/HDL Ratio: 2.2 ratio (ref 0.0–5.0)
HDL: 57 mg/dL (ref >39)
LDL Calculated: 62 mg/dL (ref 0–99)
TRIGLYCERIDES: 29 mg/dL (ref 0–149)
VLDL CHOLESTEROL CAL: 6 mg/dL (ref 5–40)

## 2017-08-06 LAB — HEPATIC FUNCTION PANEL
ALT: 36 IU/L (ref 0–44)
AST: 36 IU/L (ref 0–40)
Albumin: 4.4 g/dL (ref 3.5–5.5)
Alkaline Phosphatase: 47 IU/L (ref 39–117)
BILIRUBIN, DIRECT: 0.2 mg/dL (ref 0.00–0.40)
Bilirubin Total: 0.6 mg/dL (ref 0.0–1.2)
TOTAL PROTEIN: 7.3 g/dL (ref 6.0–8.5)

## 2017-08-12 ENCOUNTER — Encounter: Payer: Self-pay | Admitting: Family Medicine

## 2018-01-16 ENCOUNTER — Telehealth: Payer: Self-pay | Admitting: Family Medicine

## 2018-01-16 NOTE — Telephone Encounter (Signed)
Lip liv m7 psa 

## 2018-01-16 NOTE — Telephone Encounter (Signed)
Please advise 

## 2018-01-16 NOTE — Telephone Encounter (Signed)
Pt is requesting lab orders to be sent over for an upcoming physical. Last labs per epic were: hepatic, and lipid on 08/05/2017.

## 2018-01-19 ENCOUNTER — Other Ambulatory Visit: Payer: Self-pay | Admitting: Family Medicine

## 2018-01-19 DIAGNOSIS — Z0000 Encounter for general adult medical examination without abnormal findings: Secondary | ICD-10-CM

## 2018-01-19 DIAGNOSIS — E785 Hyperlipidemia, unspecified: Secondary | ICD-10-CM

## 2018-01-19 DIAGNOSIS — Z125 Encounter for screening for malignant neoplasm of prostate: Secondary | ICD-10-CM

## 2018-01-19 NOTE — Telephone Encounter (Signed)
Pt return called and was informed that labs were put in

## 2018-01-19 NOTE — Telephone Encounter (Signed)
Placed labs in Epic; called pt to let him know that labs had been placed, had to leave voicemail

## 2018-01-22 ENCOUNTER — Encounter: Payer: Managed Care, Other (non HMO) | Admitting: Family Medicine

## 2018-01-28 LAB — HEPATIC FUNCTION PANEL
ALBUMIN: 4.5 g/dL (ref 3.5–5.5)
ALK PHOS: 47 IU/L (ref 39–117)
ALT: 35 IU/L (ref 0–44)
AST: 32 IU/L (ref 0–40)
BILIRUBIN, DIRECT: 0.16 mg/dL (ref 0.00–0.40)
Bilirubin Total: 0.5 mg/dL (ref 0.0–1.2)
Total Protein: 7.2 g/dL (ref 6.0–8.5)

## 2018-01-28 LAB — LIPID PANEL
CHOLESTEROL TOTAL: 131 mg/dL (ref 100–199)
Chol/HDL Ratio: 2.5 ratio (ref 0.0–5.0)
HDL: 53 mg/dL (ref >39)
LDL Calculated: 72 mg/dL (ref 0–99)
Triglycerides: 29 mg/dL (ref 0–149)
VLDL Cholesterol Cal: 6 mg/dL (ref 5–40)

## 2018-01-28 LAB — BASIC METABOLIC PANEL
BUN / CREAT RATIO: 15 (ref 9–20)
BUN: 20 mg/dL (ref 6–24)
CO2: 26 mmol/L (ref 20–29)
CREATININE: 1.31 mg/dL — AB (ref 0.76–1.27)
Calcium: 9.5 mg/dL (ref 8.7–10.2)
Chloride: 98 mmol/L (ref 96–106)
GFR calc Af Amer: 69 mL/min/{1.73_m2} (ref >59)
GFR, EST NON AFRICAN AMERICAN: 60 mL/min/{1.73_m2} (ref >59)
Glucose: 83 mg/dL (ref 65–99)
Potassium: 4 mmol/L (ref 3.5–5.2)
SODIUM: 142 mmol/L (ref 134–144)

## 2018-01-28 LAB — PSA: PROSTATE SPECIFIC AG, SERUM: 0.4 ng/mL (ref 0.0–4.0)

## 2018-01-29 ENCOUNTER — Encounter: Payer: Self-pay | Admitting: Family Medicine

## 2018-01-29 ENCOUNTER — Ambulatory Visit: Payer: Managed Care, Other (non HMO) | Admitting: Family Medicine

## 2018-01-29 VITALS — BP 118/74 | Ht 73.0 in | Wt 204.0 lb

## 2018-01-29 DIAGNOSIS — I499 Cardiac arrhythmia, unspecified: Secondary | ICD-10-CM

## 2018-01-29 DIAGNOSIS — Z0000 Encounter for general adult medical examination without abnormal findings: Secondary | ICD-10-CM

## 2018-01-29 DIAGNOSIS — E119 Type 2 diabetes mellitus without complications: Secondary | ICD-10-CM

## 2018-01-29 DIAGNOSIS — Z0001 Encounter for general adult medical examination with abnormal findings: Secondary | ICD-10-CM

## 2018-01-29 DIAGNOSIS — I4892 Unspecified atrial flutter: Secondary | ICD-10-CM

## 2018-01-29 DIAGNOSIS — E785 Hyperlipidemia, unspecified: Secondary | ICD-10-CM

## 2018-01-29 LAB — POCT GLYCOSYLATED HEMOGLOBIN (HGB A1C): Hemoglobin A1C: 5.3

## 2018-01-29 MED ORDER — AMLODIPINE BESY-BENAZEPRIL HCL 10-20 MG PO CAPS: 1.0000 | ORAL_CAPSULE | Freq: Every day | ORAL | 1 refills | Status: DC

## 2018-01-29 MED ORDER — PRAVASTATIN SODIUM 80 MG PO TABS: 80.0000 mg | ORAL_TABLET | Freq: Every evening | ORAL | 1 refills | Status: DC

## 2018-01-29 MED ORDER — RIVAROXABAN 20 MG PO TABS: 20.0000 mg | ORAL_TABLET | Freq: Every day | ORAL | 11 refills | Status: DC

## 2018-01-29 MED ORDER — HYDROCHLOROTHIAZIDE 25 MG PO TABS: 25.0000 mg | ORAL_TABLET | Freq: Every day | ORAL | 1 refills | Status: DC

## 2018-01-29 NOTE — Patient Instructions (Signed)
Atrial Flutter °Atrial flutter is a type of abnormal heart rhythm (arrhythmia). In atrial flutter, the heartbeat is fast but regular. There are two types of atrial flutter: °· Paroxysmal atrial flutter. This type starts suddenly. It usually stops on its own soon after it starts. °· Permanent atrial flutter. This type does not go away. ° °What are the causes? °This condition may be caused by: °· A heart condition or problem, such as: °? A heart attack. °? Heart failure. °? A heart valve problem. °· A lung problem, such as: °? A blood clot in the lungs (pulmonary embolism, or PE). °? Chronic obstructive pulmonary disease. °· Poorly controlled high blood pressure (hypertension). °· Hyperthyroidism. °· Caffeine. °· Some decongestant cold medicines. °· Low levels of minerals called electrolytes in the blood. °· Cocaine. ° °What increases the risk? °This condition is more likely to develop in: °· Elderly adults. °· Men. ° °What are the signs or symptoms? °Symptoms of this condition include: °· A feeling that your heart is pounding or racing (palpitations). °· Shortness of breath. °· Chest pain. °· Feeling light-headed. °· Dizziness. °· Fainting. ° °How is this diagnosed? °This condition may be diagnosed with tests, including: °· An electrocardiogram (ECG). This is a painless test that records electrical signals in the heart. °· Holter monitoring. For this test, you wear a device that records your heartbeat for 1-2 days. °· Cardiac event monitoring. For this test, you wear a device that records your heartbeat for up to 30 days. °· An echocardiogram. This is a painless test that uses sound waves to make a picture of your heart. °· Stress test. This test records your heartbeat while you exercise. °· Blood tests. ° °How is this treated? °This condition may be treated with: °· Treatment of any underlying conditions. °· Medicine to make your heart beat more slowly. °· Medicine to keep the condition from coming back. °· A  procedure to keep the condition under control. Some procedures to do this include: °? Cardioversion. During this procedure, medicines or an electrical shock are given to make the heart beat normally. °? Ablation. During this procedure, the heart tissue that is causing the problem is destroyed. This procedure may be done if atrial flutter lasts a long time or happens often. ° °Follow these instructions at home: °· Take over-the-counter and prescription medicines only as told by your health care provider. °· Do not take any new medicines without talking to your health care provider. °· Do not use tobacco products, including cigarettes, chewing tobacco, or e-cigarettes. If you need help quitting, ask your health care provider. °· Limit alcohol intake to no more than 1 drink per day for nonpregnant women and 2 drinks per day for men. One drink equals 12 oz of beer, 5 oz of wine, or 1½ oz of hard liquor. °· Try to reduce any stress. Stress can make your symptoms worse. °Contact a health care provider if: °· Your symptoms get worse. °Get help right away if: °· You are dizzy. °· You feel like fainting or you faint. °· You have shortness of breath. °· You feel pain or pressure in your chest. °· You suddenly feel nauseous or you suddenly vomit. °· There is a sudden change in your ability to speak, eat, or move. °· You are sweating a lot for no reason. °This information is not intended to replace advice given to you by your health care provider. Make sure you discuss any questions you have with your health care   provider. °Document Released: 03/16/2009 Document Revised: 03/06/2016 Document Reviewed: 05/12/2015 °[employer] Interactive Patient Education © 2018 [employer] ° °

## 2018-01-29 NOTE — Progress Notes (Signed)
Subjective:    Patient ID: Brian Ewing, male    DOB: 27-Nov-1960, 57 y.o.   MRN: [id number]  HPI  Patient is here today to follow up on Chronic health issues. He is diabetic and has some Htn, and hyperlipidemia. He is not taking any medication currently for the Dm he is taking amlodipine 10-20 one daily for the Htn and taking pravastatin 80 mg daily for the cholesterol issues.He states he eats healthy and he does exercise. He does not see any specialists.  The patient comes in today for a wellness visit.    A review of their health history was completed.  A review of medications was also completed.  Any needed refills; No  Eating habits: Good  Falls/  MVA accidents in past few months: No Regular exercise:  Specialist pt sees on regular basis: No  Preventative health issues were discussed.   Additional concerns: None Results for orders placed or performed in visit on 01/29/18  POCT glycosylated hemoglobin (Hb A1C)  Result Value Ref Range   Hemoglobin A1C 5.3    Blood pressure medicine and blood pressure levels reviewed today with patient. Compliant with blood pressure medicine. States does not miss a dose. No obvious side effects. Blood pressure generally good when checked elsewhere. Watching salt intake.  Patient claims compliance with diabetes medication. No obvious side effects. Reports no substantial low sugar spells. Most numbers are generally in good range when checked fasting. Generally does not miss a dose of medication. Watching diabetic diet closely  Patient continues to take lipid medication regularly. No obvious side effects from it. Generally does not miss a dose. Prior blood work results are reviewed with patient. Patient continues to work on fat intake in diet    Results for orders placed or performed in visit on 01/29/18  POCT glycosylated hemoglobin (Hb A1C)  Result Value Ref Range   Hemoglobin A1C 5.3    Recent Results (from the past 2160 hour(s))    Basic Metabolic Panel (BMET)     Status: Abnormal   Collection Time: 01/27/18  8:06 AM  Result Value Ref Range   Glucose 83 65 - 99 mg/dL   BUN 20 6 - 24 mg/dL   Creatinine, Ser 9.141.31 (H) 0.76 - 1.27 mg/dL   GFR calc non Af Amer 60 >59 mL/min/1.73   GFR calc Af Amer 69 >59 mL/min/1.73   BUN/Creatinine Ratio 15 9 - 20   Sodium 142 134 - 144 mmol/L   Potassium 4.0 3.5 - 5.2 mmol/L   Chloride 98 96 - 106 mmol/L   CO2 26 20 - 29 mmol/L   Calcium 9.5 8.7 - 10.2 mg/dL  Hepatic function panel     Status: None   Collection Time: 01/27/18  8:06 AM  Result Value Ref Range   Total Protein 7.2 6.0 - 8.5 g/dL   Albumin 4.5 3.5 - 5.5 g/dL   Bilirubin Total 0.5 0.0 - 1.2 mg/dL   Bilirubin, Direct 7.820.16 0.00 - 0.40 mg/dL   Alkaline Phosphatase 47 39 - 117 IU/L   AST 32 0 - 40 IU/L   ALT 35 0 - 44 IU/L  Lipid Profile     Status: None   Collection Time: 01/27/18  8:06 AM  Result Value Ref Range   Cholesterol, Total 131 100 - 199 mg/dL   Triglycerides 29 0 - 149 mg/dL   HDL 53 >95>39 mg/dL   VLDL Cholesterol Cal 6 5 - 40 mg/dL   LDL Calculated 72  0 - 99 mg/dL   Chol/HDL Ratio 2.5 0.0 - 5.0 ratio    Comment:                                   T. Chol/HDL Ratio                                             Men  Women                               1/2 Avg.Risk  3.4    3.3                                   Avg.Risk  5.0    4.4                                2X Avg.Risk  9.6    7.1                                3X Avg.Risk 23.4   11.0   PSA     Status: None   Collection Time: 01/27/18  8:06 AM  Result Value Ref Range   Prostate Specific Ag, Serum 0.4 0.0 - 4.0 ng/mL    Comment: Roche ECLIA methodology. According to the American Urological Association, Serum PSA should decrease and remain at undetectable levels after radical prostatectomy. The AUA defines biochemical recurrence as an initial PSA value 0.2 ng/mL or greater followed by a subsequent confirmatory PSA value 0.2 ng/mL or  greater. Values obtained with different assay methods or kits cannot be used interchangeably. Results cannot be interpreted as absolute evidence of the presence or absence of malignant disease.   POCT glycosylated hemoglobin (Hb A1C)     Status: None   Collection Time: 01/29/18  9:39 AM  Result Value Ref Range   Hemoglobin A1C 5.3     Review of Systems  Constitutional: Negative for activity change, appetite change and fever.  HENT: Negative for congestion and rhinorrhea.   Eyes: Negative for discharge.  Respiratory: Negative for cough and wheezing.   Cardiovascular: Negative for chest pain.  Gastrointestinal: Negative for abdominal pain, blood in stool and vomiting.  Genitourinary: Negative for difficulty urinating and frequency.  Musculoskeletal: Negative for neck pain.  Skin: Negative for rash.  Allergic/Immunologic: Negative for environmental allergies and food allergies.  Neurological: Negative for weakness and headaches.  Psychiatric/Behavioral: Negative for agitation.  All other systems reviewed and are negative.      Objective:   Physical Exam  Constitutional: He appears well-developed and well-nourished.  HENT:  Head: Normocephalic and atraumatic.  Right Ear: External ear normal.  Left Ear: External ear normal.  Nose: Nose normal.  Mouth/Throat: Oropharynx is clear and moist.  Eyes: Right eye exhibits no discharge. Left eye exhibits no discharge. No scleral icterus.  Neck: Normal range of motion. Neck supple. No thyromegaly present.  Cardiovascular: Normal rate, regular rhythm and normal heart sounds.  No murmur heard. Pulmonary/Chest: Effort normal and breath sounds normal. No respiratory distress. He has  no wheezes.  Abdominal: Soft. Bowel sounds are normal. He exhibits no distension and no mass. There is no tenderness.  Genitourinary: Penis normal.  Musculoskeletal: Normal range of motion. He exhibits no edema.  Lymphadenopathy:    He has no cervical  adenopathy.  Neurological: He is alert. He exhibits normal muscle tone. Coordination normal.  Skin: Skin is warm and dry. No erythema.  Psychiatric: He has a normal mood and affect. His behavior is normal. Judgment normal.  Vitals reviewed.         Assessment & Plan:  Impression wellness exam diet discussed.  Exercise discussed vaccinations discussed.  Up-to-date on colonoscopy  2.  Arrhythmia discovered today.  Occasional spells of irregular irregular rhythms.  EKG reveals runs of a flutter with irregular response.  Very long discussion held.  Will hold aspirin.  Initiate Xarelto additional discussed.  Cardiology consult rationale discussed  3.  Type 2 diabetes discussed A1c good control  4.  Hyperlipidemia discussed continue same treatment compliance discussed  5.  Hypertension good control discussed to maintain same meds.  Patient's nifedipine is one reason is heart rate is in decent control during these runs of a flutter discussed cardiology referral medications refilled changes as noted above

## 2018-02-02 ENCOUNTER — Encounter: Payer: Self-pay | Admitting: Family Medicine

## 2018-02-07 ENCOUNTER — Encounter: Payer: Self-pay | Admitting: Family Medicine

## 2018-02-17 ENCOUNTER — Encounter: Payer: Self-pay | Admitting: Cardiology

## 2018-02-17 ENCOUNTER — Ambulatory Visit: Payer: Managed Care, Other (non HMO) | Admitting: Cardiology

## 2018-02-17 VITALS — BP 104/68 | HR 51 | Ht 73.0 in | Wt 207.6 lb

## 2018-02-17 DIAGNOSIS — R9431 Abnormal electrocardiogram [ECG] [EKG]: Secondary | ICD-10-CM | POA: Diagnosis not present

## 2018-02-17 DIAGNOSIS — I4892 Unspecified atrial flutter: Secondary | ICD-10-CM

## 2018-02-17 NOTE — Patient Instructions (Signed)
Your physician wants you to follow-up in: 6 MONTHS WITH DR. BRANCH You will receive a reminder letter in the mail two months in advance. If you don't receive a letter, please call our office to schedule the follow-up appointment.  Your physician recommends that you continue on your current medications as directed. Please refer to the Current Medication list given to you today.  Your physician has requested that you have an echocardiogram. Echocardiography is a painless test that uses sound waves to create images of your heart. It provides your doctor with information about the size and shape of your heart and how well your heart's chambers and valves are working. This procedure takes approximately one hour. There are no restrictions for this procedure.  Thank you for choosing [facility]!!   

## 2018-02-17 NOTE — Progress Notes (Signed)
Clinical Summary  Brian Ewing is a 57 y.o.male seen as new consult, referred by Dr Gerda DissLuking for abnormal heart rhythm   1. Arrhythmia/Aflutter - noted at most recent pcp visit. EKG revirewed, shows run of aflutter with irregular response. - started on xarelto by pcp 01/29/18 - denies any palpitations. No recent SOB or DOE.  CHADS2: HTN, DM2    Past Medical History:  Diagnosis Date  . Diabetes mellitus without complication (HCC)   . Elevated hemoglobin A1c   . Hyperlipidemia   . Hypertension      No Known Allergies   Current Outpatient Medications  Medication Sig Dispense Refill  . amLODipine-benazepril (LOTREL) 10-20 MG capsule Take 1 capsule by mouth daily. 90 capsule 1  . aspirin EC 81 MG tablet Take 81 mg by mouth daily.    . hydrochlorothiazide (HYDRODIURIL) 25 MG tablet Take 1 tablet (25 mg total) by mouth daily. 90 tablet 1  . HYDROcodone-acetaminophen (NORCO/VICODIN) 5-325 MG tablet Take 1 tablet by mouth every 6 (six) hours as needed. (Patient not taking: Reported on 01/29/2018) 24 tablet 0  . pravastatin (PRAVACHOL) 80 MG tablet Take 1 tablet (80 mg total) by mouth every evening. 90 tablet 1  . rivaroxaban (XARELTO) 20 MG TABS tablet Take 1 tablet (20 mg total) by mouth daily with supper. 30 tablet 11  . sildenafil (REVATIO) 20 MG tablet TAKE 2  1/2 (TWO AND ONE-HALF) TABLETS BY MOUTH AS NEEDED 30 tablet 1   No current facility-administered medications for this visit.      Past Surgical History:  Procedure Laterality Date  . ARTHROSCOPY KNEE W/ DRILLING       No Known Allergies    No family history on file.   Social History Brian Ewing reports that he has never smoked. He has never used smokeless tobacco. Brian Ewing reports that he does not drink alcohol.   Review of Systems CONSTITUTIONAL: No weight loss, fever, chills, weakness or fatigue.  HEENT: Eyes: No visual loss, blurred vision, double vision or yellow sclerae.No hearing loss, sneezing,  congestion, runny nose or sore throat.  SKIN: No rash or itching.  CARDIOVASCULAR: per hpi RESPIRATORY: No shortness of breath, cough or sputum.  GASTROINTESTINAL: No anorexia, nausea, vomiting or diarrhea. No abdominal pain or blood.  GENITOURINARY: No burning on urination, no polyuria NEUROLOGICAL: No headache, dizziness, syncope, paralysis, ataxia, numbness or tingling in the extremities. No change in bowel or bladder control.  MUSCULOSKELETAL: No muscle, back pain, joint pain or stiffness.  LYMPHATICS: No enlarged nodes. No history of splenectomy.  PSYCHIATRIC: No history of depression or anxiety.  ENDOCRINOLOGIC: No reports of sweating, cold or heat intolerance. No polyuria or polydipsia.  Marland Kitchen.   Physical Examination Vitals:   02/17/18 0817  BP: 104/68  Pulse: (!) 51  SpO2: 99%   Vitals:   02/17/18 0817  Weight: 207 lb 9.6 oz (94.2 kg)  Height: 6\' 1"  (1.854 m)    Gen: resting comfortably, no acute distress HEENT: no scleral icterus, pupils equal round and reactive, no palptable cervical adenopathy,  CV: RRR, no m/r/g, no jvd Resp: Clear to auscultation bilaterally GI: abdomen is soft, non-tender, non-distended, normal bowel sounds, no hepatosplenomegaly MSK: extremities are warm, no edema.  Skin: warm, no rash Neuro:  no focal deficits Psych: appropriate affect      Assessment and Plan  1. Aflutter - new diagnosis, paroxysmal - no significant symptoms - baseline HR's are low, will not start av nodal agent at this time -  continue xarelto - obtain echo     F/u 6 months   Antoine Poche, M.D.

## 2018-02-22 ENCOUNTER — Encounter: Payer: Self-pay | Admitting: Cardiology

## 2018-03-04 ENCOUNTER — Ambulatory Visit (INDEPENDENT_AMBULATORY_CARE_PROVIDER_SITE_OTHER): Payer: Managed Care, Other (non HMO)

## 2018-03-04 ENCOUNTER — Other Ambulatory Visit: Payer: Self-pay

## 2018-03-04 DIAGNOSIS — I4892 Unspecified atrial flutter: Secondary | ICD-10-CM

## 2018-03-09 ENCOUNTER — Telehealth: Payer: Self-pay

## 2018-03-09 NOTE — Telephone Encounter (Signed)
-----   Message from Antoine Poche, MD sent at 03/06/2018  3:25 PM EDT ----- Echo looks good, normal heart function   Dominga Ferry MD

## 2018-03-09 NOTE — Telephone Encounter (Signed)
Patient notified. Routed to PCP 

## 2018-03-16 ENCOUNTER — Ambulatory Visit: Payer: Managed Care, Other (non HMO) | Admitting: Family Medicine

## 2018-03-16 VITALS — BP 116/74 | Ht 73.0 in | Wt 207.0 lb

## 2018-03-16 DIAGNOSIS — L03116 Cellulitis of left lower limb: Secondary | ICD-10-CM | POA: Diagnosis not present

## 2018-03-16 MED ORDER — DOXYCYCLINE HYCLATE 100 MG PO TABS: 100.0000 mg | ORAL_TABLET | Freq: Two times a day (BID) | ORAL | 0 refills | Status: DC

## 2018-03-16 NOTE — Progress Notes (Signed)
   Subjective:    Patient ID: Brian Ewing, male    DOB: 1961-08-16, 57 y.o.   MRN: [id number]  HPISwelling in lower left leg. Started 6 -7 days ago. Not tried any treatments.    Left lower leg tender and swollen  Wondered at first if yard boots caused it   Patient recalls no injury some tenderness and swelling.  Rose over the past week.  Had a similar issue a couple years ago that turned into a bolus complication with secondary skin ulceration and very slow healing.  States sugars in good control   Review of Systems No headache, no major weight loss or weight gain, no chest pain no back pain abdominal pain no change in bowel habits complete ROS otherwise negative     Objective:   Physical Exam  Alert vitals stable, NAD. Blood pressure good on repeat. HEENT normal. Lungs clear. Heart regular rate and rhythm.   Bilateral ankles small patch of cellulitis warm to touch tender mild edema     Assessment & Plan:  Impression focal cellulitis plan Doxy-100 twice daily 10 days symptom care discussed warning signs discussed

## 2018-05-10 ENCOUNTER — Other Ambulatory Visit: Payer: Self-pay | Admitting: Family Medicine

## 2018-06-18 ENCOUNTER — Encounter: Payer: Self-pay | Admitting: Family Medicine

## 2018-06-18 ENCOUNTER — Ambulatory Visit: Payer: Managed Care, Other (non HMO) | Admitting: Family Medicine

## 2018-06-18 VITALS — BP 120/80 | Ht 73.0 in | Wt 205.0 lb

## 2018-06-18 DIAGNOSIS — S46912A Strain of unspecified muscle, fascia and tendon at shoulder and upper arm level, left arm, initial encounter: Secondary | ICD-10-CM

## 2018-06-18 DIAGNOSIS — M25512 Pain in left shoulder: Secondary | ICD-10-CM | POA: Diagnosis not present

## 2018-06-18 MED ORDER — CHLORZOXAZONE 500 MG PO TABS: 500.0000 mg | ORAL_TABLET | Freq: Three times a day (TID) | ORAL | 1 refills | Status: DC | PRN

## 2018-06-18 MED ORDER — PREDNISONE 10 MG PO TABS: ORAL_TABLET | ORAL | 0 refills | Status: DC

## 2018-06-18 NOTE — Progress Notes (Signed)
   Subjective:    Patient ID: Brian Ewing, male    DOB: [birth year], 57 y.o.   MRN: [id number]  HPI Patient is here today with complaints of a pinched nerve in left shoulder blade area, ongoing for a few days. Has taken Ibuprofen.  week or so had a catch sens in the post shoulder   Increasingly painfu;    Pain feels sharp, aching also   Worse with movement  Has tried ibu   Wife utilized hemp,cbd to try and help    Right handed   Takes 400 ibu twice per day     Review of Systems No headache, no major weight loss or weight gain, no chest pain no back pain abdominal pain no change in bowel habits complete ROS otherwise negative     Objective:   Physical Exam  Alert vitals stable, NAD. Blood pressure good on repeat. HEENT normal. Lungs clear. Heart regular rate and rhythm. Left shoulder good range of motion.  No treatment management sign.  Distinct periscapular tenderness to deep palpation.  Distal grip strength intact     Assessment & Plan:  Impression scapular strain most likely diagnosis by far discussed plan moderate prednisone taper.  Patient has diabetes but also on anticoagulant.  Also had muscle spasm agent local measures discussed expect slow resolution

## 2018-07-31 ENCOUNTER — Ambulatory Visit: Payer: Managed Care, Other (non HMO) | Admitting: Family Medicine

## 2018-08-05 ENCOUNTER — Ambulatory Visit (HOSPITAL_COMMUNITY)
Admission: RE | Admit: 2018-08-05 | Discharge: 2018-08-05 | Disposition: A | Discharge status: Home / Self Care | Payer: Managed Care, Other (non HMO) | Source: Ambulatory Visit | Attending: Family Medicine | Admitting: Family Medicine

## 2018-08-05 ENCOUNTER — Ambulatory Visit: Payer: Managed Care, Other (non HMO) | Admitting: Family Medicine

## 2018-08-05 ENCOUNTER — Encounter: Payer: Self-pay | Admitting: Family Medicine

## 2018-08-05 VITALS — BP 112/76 | Ht 71.5 in | Wt 207.0 lb

## 2018-08-05 DIAGNOSIS — M19012 Primary osteoarthritis, left shoulder: Secondary | ICD-10-CM | POA: Diagnosis not present

## 2018-08-05 DIAGNOSIS — M25512 Pain in left shoulder: Secondary | ICD-10-CM | POA: Diagnosis present

## 2018-08-05 DIAGNOSIS — E119 Type 2 diabetes mellitus without complications: Secondary | ICD-10-CM | POA: Diagnosis not present

## 2018-08-05 DIAGNOSIS — E785 Hyperlipidemia, unspecified: Secondary | ICD-10-CM

## 2018-08-05 DIAGNOSIS — Z79899 Other long term (current) drug therapy: Secondary | ICD-10-CM

## 2018-08-05 DIAGNOSIS — Z23 Encounter for immunization: Secondary | ICD-10-CM

## 2018-08-05 MED ORDER — HYDROCHLOROTHIAZIDE 25 MG PO TABS: 25.0000 mg | ORAL_TABLET | Freq: Every day | ORAL | 1 refills | Status: DC

## 2018-08-05 MED ORDER — RIVAROXABAN 20 MG PO TABS: 20.0000 mg | ORAL_TABLET | Freq: Every day | ORAL | 5 refills | Status: DC

## 2018-08-05 MED ORDER — SILDENAFIL CITRATE 20 MG PO TABS: ORAL_TABLET | ORAL | 5 refills | Status: DC

## 2018-08-05 MED ORDER — PRAVASTATIN SODIUM 80 MG PO TABS: 80.0000 mg | ORAL_TABLET | Freq: Every evening | ORAL | 1 refills | Status: DC

## 2018-08-05 MED ORDER — AMLODIPINE BESY-BENAZEPRIL HCL 10-20 MG PO CAPS: 1.0000 | ORAL_CAPSULE | Freq: Every day | ORAL | 1 refills | Status: DC

## 2018-08-05 NOTE — Progress Notes (Signed)
   Subjective:    Patient ID: Brian Ewing, male    DOB: [birth year], 57 y.o.   MRN: [id number]  Hyperlipidemia  This is a chronic problem. Treatments tried: pravastatin. There are no compliance problems (takes meds every day, exercises, eats healthy).    Left shoulder pain. Started one month ago. Pt thinks it might be xarelto because he read it was a side effect of med.   Pain overall berter compared to ast visit  But still painwith extentsion  After a few hrs at work , gets to aching significant  Pt has been taking high dose tylenol  Diabetes.  Patient claims compliance with diabetes medication. No obvious side effects. Reports no substantial low sugar spells. Most numbers are generally in good range when checked fasting. Generally does not miss a dose of medication. Watching diabetic diet closely  Patient continues to take lipid medication regularly. No obvious side effects from it. Generally does not miss a dose. Prior blood work results are reviewed with patient. Patient continues to work on fat intake in diet  Blood pressure medicine and blood pressure levels reviewed today with patient. Compliant with blood pressure medicine. States does not miss a dose. No obvious side effects. Blood pressure generally good when checked elsewhere. Watching salt intake.     Results for orders placed or performed in visit on 01/29/18  POCT glycosylated hemoglobin (Hb A1C)  Result Value Ref Range   Hemoglobin A1C 5.3      Flu vaccine today.   Review of Systems No headache, no major weight loss or weight gain, no chest pain no back pain abdominal pain no change in bowel habits complete ROS otherwise negative     Objective:   Physical Exam  Alert and oriented, vitals reviewed and stable, NAD ENT-TM's and ext canals WNL bilat via otoscopic exam Soft palate, tonsils and post pharynx WNL via oropharyngeal exam Neck-symmetric, no masses; thyroid nonpalpable and nontender Pulmonary-no  tachypnea or accessory muscle use; Clear without wheezes via auscultation Card--no abnrml murmurs, rhythm reg and rate WNL Carotid pulses symmetric, without bruits Positive impingement sign left shoulder      Assessment & Plan:  Impression type 2 diabetes.  A1c pending.  Prior A1c is reviewed.  Sugars overall good.  Diet discussed.  2.  Subacute left shoulder pain.  Positive impingement signs.  Will x-ray.  Codman's exercises discussed  3.  Hypertension.  Good control discussed maintain same meds  4.  Hyperlipidemia.  Prior blood work discussed good control to maintain same  Plan all as noted above.  Further recommendations based on results.  Flu shot today.  Follow-up in 6 months wellness [facility] chronic

## 2018-08-08 LAB — HEMOGLOBIN A1C
Est. average glucose Bld gHb Est-mCnc: 120 mg/dL
HEMOGLOBIN A1C: 5.8 % — AB (ref 4.8–5.6)

## 2018-08-08 LAB — LIPID PANEL
CHOL/HDL RATIO: 2.6 ratio (ref 0.0–5.0)
Cholesterol, Total: 143 mg/dL (ref 100–199)
HDL: 55 mg/dL (ref >39)
LDL Calculated: 82 mg/dL (ref 0–99)
Triglycerides: 28 mg/dL (ref 0–149)
VLDL Cholesterol Cal: 6 mg/dL (ref 5–40)

## 2018-08-08 LAB — HEPATIC FUNCTION PANEL
ALT: 29 IU/L (ref 0–44)
AST: 25 IU/L (ref 0–40)
Albumin: 4.4 g/dL (ref 3.5–5.5)
Alkaline Phosphatase: 43 IU/L (ref 39–117)
BILIRUBIN TOTAL: 0.5 mg/dL (ref 0.0–1.2)
BILIRUBIN, DIRECT: 0.15 mg/dL (ref 0.00–0.40)
TOTAL PROTEIN: 7.2 g/dL (ref 6.0–8.5)

## 2018-08-09 ENCOUNTER — Encounter: Payer: Self-pay | Admitting: Family Medicine

## 2018-10-14 ENCOUNTER — Ambulatory Visit (INDEPENDENT_AMBULATORY_CARE_PROVIDER_SITE_OTHER): Payer: Managed Care, Other (non HMO) | Admitting: Cardiology

## 2018-10-14 ENCOUNTER — Encounter: Payer: Self-pay | Admitting: Cardiology

## 2018-10-14 VITALS — BP 116/74 | HR 77 | Ht 72.0 in | Wt 210.8 lb

## 2018-10-14 DIAGNOSIS — I10 Essential (primary) hypertension: Secondary | ICD-10-CM | POA: Diagnosis not present

## 2018-10-14 DIAGNOSIS — I4892 Unspecified atrial flutter: Secondary | ICD-10-CM | POA: Diagnosis not present

## 2018-10-14 NOTE — Patient Instructions (Signed)

## 2018-10-14 NOTE — Progress Notes (Signed)
Clinical Summary Mr. Brian Ewing is a 57 y.o.male seen today for follow up of the following medical problems.   1. Paroxysmal Aflutter - no recent palpitations - compliant with meds - no bleeding on xarelto.   CHADS2 is 2: HTN, DM2  Past Medical History:  Diagnosis Date  . Diabetes mellitus without complication (HCC)   . Elevated hemoglobin A1c   . Hyperlipidemia   . Hypertension      No Known Allergies   Current Outpatient Medications  Medication Sig Dispense Refill  . amLODipine-benazepril (LOTREL) 10-20 MG capsule Take 1 capsule by mouth daily. 90 capsule 1  . chlorzoxazone (PARAFON) 500 MG tablet Take 1 tablet (500 mg total) by mouth 3 (three) times daily as needed for muscle spasms. 30 tablet 1  . hydrochlorothiazide (HYDRODIURIL) 25 MG tablet Take 1 tablet (25 mg total) by mouth daily. 90 tablet 1  . pravastatin (PRAVACHOL) 80 MG tablet Take 1 tablet (80 mg total) by mouth every evening. 90 tablet 1  . rivaroxaban (XARELTO) 20 MG TABS tablet Take 1 tablet (20 mg total) by mouth daily with supper. 30 tablet 5  . sildenafil (REVATIO) 20 MG tablet TAKE TWO AND ONE-HALF TABLETS BY MOUTH AS NEEDED 30 tablet 5   No current facility-administered medications for this visit.      Past Surgical History:  Procedure Laterality Date  . ARTHROSCOPY KNEE W/ DRILLING       No Known Allergies    No family history on file.   Social History Mr. Brian Ewing reports that he has never smoked. He has never used smokeless tobacco. Mr. Brian Ewing reports that he does not drink alcohol.   Review of Systems CONSTITUTIONAL: No weight loss, fever, chills, weakness or fatigue.  HEENT: Eyes: No visual loss, blurred vision, double vision or yellow sclerae.No hearing loss, sneezing, congestion, runny nose or sore throat.  SKIN: No rash or itching.  CARDIOVASCULAR: per hpi RESPIRATORY: No shortness of breath, cough or sputum.  GASTROINTESTINAL: No anorexia, nausea, vomiting or diarrhea.  No abdominal pain or blood.  GENITOURINARY: No burning on urination, no polyuria NEUROLOGICAL: No headache, dizziness, syncope, paralysis, ataxia, numbness or tingling in the extremities. No change in bowel or bladder control.  MUSCULOSKELETAL: No muscle, back pain, joint pain or stiffness.  LYMPHATICS: No enlarged nodes. No history of splenectomy.  PSYCHIATRIC: No history of depression or anxiety.  ENDOCRINOLOGIC: No reports of sweating, cold or heat intolerance. No polyuria or polydipsia.  Marland Kitchen.   Physical Examination Vitals:   10/14/18 1435  BP: 116/74  Pulse: 77  SpO2: 98%   Vitals:   10/14/18 1435  Weight: 210 lb 12.8 oz (95.6 kg)  Height: 6' (1.829 m)    Gen: resting comfortably, no acute distress HEENT: no scleral icterus, pupils equal round and reactive, no palptable cervical adenopathy,  CV: RRR, no m/r/g, no jvd Resp: Clear to auscultation bilaterally GI: abdomen is soft, non-tender, non-distended, normal bowel sounds, no hepatosplenomegaly MSK: extremities are warm, no edema.  Skin: warm, no rash Neuro:  no focal deficits Psych: appropriate affect   Diagnostic Studies  02/2018 echo Study Conclusions  - Left ventricle: The cavity size was normal. Wall thickness was   normal. Systolic function was normal. The estimated ejection   fraction was in the range of 55% to 60%. Wall motion was normal;   there were no regional wall motion abnormalities. The study is   not technically sufficient to allow evaluation of LV diastolic   function due  to atrial flutter. - Mitral valve: There was mild regurgitation.   Assessment and Plan  1. Paroxysmal Aflutter - no symptoms. Has not required av nodal agent, when in SR has had low normal rates - EKG shows short runs of aflutter - continue xaretlo - if symptomatic or more sustained Aflutter would consider av nodal agent   2. HTN - at goal, continue current meds     Antoine Poche, M.D.

## 2018-12-24 ENCOUNTER — Other Ambulatory Visit: Payer: Self-pay

## 2018-12-24 ENCOUNTER — Other Ambulatory Visit: Payer: Self-pay | Admitting: *Deleted

## 2018-12-24 ENCOUNTER — Telehealth: Payer: Self-pay

## 2018-12-24 MED ORDER — SILDENAFIL CITRATE 20 MG PO TABS: ORAL_TABLET | ORAL | 5 refills | Status: DC

## 2018-12-24 NOTE — Telephone Encounter (Signed)
Refills sent to pharm

## 2018-12-24 NOTE — Telephone Encounter (Signed)
Six mo worth 

## 2018-12-24 NOTE — Telephone Encounter (Signed)
Per Karin Golden on [address] [place], [place].  Patient is needing refills on Xarelto 20 mg one po with supper. # 30  Please advise.

## 2018-12-29 ENCOUNTER — Other Ambulatory Visit: Payer: Self-pay | Admitting: Family Medicine

## 2019-02-03 ENCOUNTER — Ambulatory Visit: Payer: Managed Care, Other (non HMO) | Admitting: Family Medicine

## 2019-02-09 ENCOUNTER — Other Ambulatory Visit: Payer: Self-pay | Admitting: Family Medicine

## 2019-02-09 NOTE — Telephone Encounter (Signed)
Call pt sched virtual visit this week due for six mo visit

## 2019-02-11 ENCOUNTER — Telehealth: Payer: Self-pay | Admitting: Family Medicine

## 2019-02-11 ENCOUNTER — Other Ambulatory Visit: Payer: Self-pay | Admitting: Family Medicine

## 2019-02-11 MED ORDER — BLOOD GLUCOSE METER KIT: PACK | 0 refills | Status: DC

## 2019-02-11 NOTE — Telephone Encounter (Signed)
Patient is requesting new prescription for test scripts and needles to be called in. He is completely out needing today. Karin Golden [employer].He has phone visit in the morning with Dr. Waldon Reining

## 2019-02-11 NOTE — Telephone Encounter (Signed)
Spoke with patient. Pt is checking sugars once a week. Pt has phone visit with provider on 02/12/2019. Script printed and awaiting signature. Pt is aware that we will send script to [facility] in [place]

## 2019-02-12 ENCOUNTER — Encounter: Payer: Self-pay | Admitting: Family Medicine

## 2019-02-12 ENCOUNTER — Other Ambulatory Visit: Payer: Self-pay

## 2019-02-12 ENCOUNTER — Ambulatory Visit (INDEPENDENT_AMBULATORY_CARE_PROVIDER_SITE_OTHER): Payer: Managed Care, Other (non HMO) | Admitting: Family Medicine

## 2019-02-12 VITALS — BP 117/76 | HR 83 | Ht 72.0 in | Wt 208.0 lb

## 2019-02-12 DIAGNOSIS — E785 Hyperlipidemia, unspecified: Secondary | ICD-10-CM | POA: Diagnosis not present

## 2019-02-12 DIAGNOSIS — I10 Essential (primary) hypertension: Secondary | ICD-10-CM | POA: Diagnosis not present

## 2019-02-12 DIAGNOSIS — I4892 Unspecified atrial flutter: Secondary | ICD-10-CM | POA: Diagnosis not present

## 2019-02-12 DIAGNOSIS — E119 Type 2 diabetes mellitus without complications: Secondary | ICD-10-CM | POA: Diagnosis not present

## 2019-02-12 MED ORDER — AMLODIPINE BESY-BENAZEPRIL HCL 10-20 MG PO CAPS: ORAL_CAPSULE | ORAL | 1 refills | Status: DC

## 2019-02-12 MED ORDER — HYDROCHLOROTHIAZIDE 25 MG PO TABS: 25.0000 mg | ORAL_TABLET | Freq: Every day | ORAL | 1 refills | Status: DC

## 2019-02-12 MED ORDER — PRAVASTATIN SODIUM 80 MG PO TABS: 80.0000 mg | ORAL_TABLET | Freq: Every evening | ORAL | 1 refills | Status: DC

## 2019-02-12 NOTE — Progress Notes (Signed)
   Subjective:    Patient ID: Brian Ewing, male    DOB: [birth year], 58 y.o.   MRN: [id number]  Per pt bp this morning 117/76, pulse 83, weight 208 lb  Hyperlipidemia  This is a chronic problem. Treatments tried: pravastatin 20. There are no compliance problems (takes meds every day, eats healthy, mows yards for exercise).    Diabetes. No on any diabetic meds. Today blood sugar was 85 and he states that is about average. Checks sugar about every 2 weeks.   Pt states no concerns today.   Virtual Visit via Telephone Note  I connected with Brian Ewing on 02/12/19 at  9:00 AM EDT by telephone and verified that I am speaking with the correct person using two identifiers.   I discussed the limitations, risks, security and privacy concerns of performing an evaluation and management service by telephone and the availability of in person appointments. I also discussed with the patient that there may be a patient responsible charge related to this service. The patient expressed understanding and agreed to proceed.  Patient claims compliance with diabetes medication. No obvious side effects. Reports no substantial low sugar spells. Most numbers are generally in good range when checked fasting. Generally does not miss a dose of medication. Watching diabetic diet closely   80s every time     Blood pressure medicine and blood pressure levels reviewed today with patient. Compliant with blood pressure medicine. States does not miss a dose. No obvious side effects. Blood pressure generally good when checked elsewhere. Watching salt intake.   Patient continues to take lipid medication regularly. No obvious side effects from it. Generally does not miss a dose. Prior blood work results are reviewed with patient. Patient continues to work on fat intake in diet  bp 117 over 76 p 3  Still working as a IT trainer   History of Present Illness:    Observations/Objective:   Assessment and Plan:    Follow Up Instructions:    I discussed the assessment and treatment plan with the patient. The patient was provided an opportunity to ask questions and all were answered. The patient agreed with the plan and demonstrated an understanding of the instructions.   The patient was advised to call back or seek an in-person evaluation if the symptoms worsen or if the condition fails to improve as anticipated.  I provided 25 minutes of non-face-to-face time during this encounter.   Kyra Manges, LPN   Review of Systems     Objective:   Physical Exam  Physical not performed due to virtual visit      Assessment & Plan:  Impression type 2 diabetes.  Good control discussed maintain same dose of meds diet exercise discussed  2.  Hypertension.  Blood pressure numbers reviewed side effects benefits of medicine reviewed to maintain same  3.  Hyperlipidemia.  Unable to do blood work at this time.  Rationale discussed to maintain same discussed  4.  Chronic Xarelto for atrial fib patient maintain  Follow-up in 6 months for wellness plus chronic

## 2019-05-10 ENCOUNTER — Encounter: Payer: Self-pay | Admitting: Cardiology

## 2019-05-10 ENCOUNTER — Other Ambulatory Visit: Payer: Self-pay

## 2019-05-10 ENCOUNTER — Ambulatory Visit: Payer: Managed Care, Other (non HMO) | Admitting: Cardiology

## 2019-05-10 VITALS — BP 110/64 | HR 70 | Ht 72.0 in | Wt 219.2 lb

## 2019-05-10 DIAGNOSIS — E782 Mixed hyperlipidemia: Secondary | ICD-10-CM | POA: Diagnosis not present

## 2019-05-10 DIAGNOSIS — I4892 Unspecified atrial flutter: Secondary | ICD-10-CM | POA: Diagnosis not present

## 2019-05-10 DIAGNOSIS — I10 Essential (primary) hypertension: Secondary | ICD-10-CM

## 2019-05-10 NOTE — Patient Instructions (Signed)

## 2019-05-10 NOTE — Progress Notes (Signed)
Clinical Summary Brian Ewing is a 58 y.o.male seen today for follow up of the following medical problems.   1. Paroxysmal Aflutter  CHADS2 is 2: HTN, DM2   - no recent palpitations - compliant with meds   2. HTN - compliant with meds   3. Hyperlipidemia - 07/2018 TC 143 TG 28 HDL 55 LDL 82 - compliant with statin  SH: works as Aeronautical engineer at [employer]     Past Medical History:  Diagnosis Date  . Diabetes mellitus without complication ([place])   . Elevated hemoglobin A1c   . Hyperlipidemia   . Hypertension      No Known Allergies   Current Outpatient Medications  Medication Sig Dispense Refill  . amLODipine-benazepril (LOTREL) 10-20 MG capsule TAKE 1 CAPSULE(S) BY MOUTH DAILY 90 capsule 1  . blood glucose meter kit and supplies Dispense based on patient and insurance preference. Test sugar once a day. 1 each 0  . chlorzoxazone (PARAFON) 500 MG tablet Take 1 tablet (500 mg total) by mouth 3 (three) times daily as needed for muscle spasms. 30 tablet 1  . hydrochlorothiazide (HYDRODIURIL) 25 MG tablet Take 1 tablet (25 mg total) by mouth daily. 90 tablet 1  . pravastatin (PRAVACHOL) 80 MG tablet Take 1 tablet (80 mg total) by mouth every evening. 90 tablet 1  . sildenafil (REVATIO) 20 MG tablet TAKE TWO AND ONE-HALF TABLETS BY MOUTH AS NEEDED 30 tablet 5  . XARELTO 20 MG TABS tablet TAKE ONE TABLET BY MOUTH DAILY WITH SUPPER 90 tablet 10   No current facility-administered medications for this visit.      Past Surgical History:  Procedure Laterality Date  . ARTHROSCOPY KNEE W/ DRILLING       No Known Allergies    Family History  Problem Relation Age of Onset  . Stomach cancer Father        20 when he passed   . Heart disease Father        bypass surgery at some point      Social History Brian Ewing reports that he has never smoked. He has never used smokeless tobacco. Brian Ewing reports no history of alcohol use.    Review of Systems CONSTITUTIONAL: No weight loss, fever, chills, weakness or fatigue.  HEENT: Eyes: No visual loss, blurred vision, double vision or yellow sclerae.No hearing loss, sneezing, congestion, runny nose or sore throat.  SKIN: No rash or itching.  CARDIOVASCULAR: per hpi RESPIRATORY: No shortness of breath, cough or sputum.  GASTROINTESTINAL: No anorexia, nausea, vomiting or diarrhea. No abdominal pain or blood.  GENITOURINARY: No burning on urination, no polyuria NEUROLOGICAL: No headache, dizziness, syncope, paralysis, ataxia, numbness or tingling in the extremities. No change in bowel or bladder control.  MUSCULOSKELETAL: No muscle, back pain, joint pain or stiffness.  LYMPHATICS: No enlarged nodes. No history of splenectomy.  PSYCHIATRIC: No history of depression or anxiety.  ENDOCRINOLOGIC: No reports of sweating, cold or heat intolerance. No polyuria or polydipsia.  Marland Kitchen   Physical Examination Today's Vitals   05/10/19 1412  BP: 110/64  Pulse: 70  SpO2: 99%  Weight: 219 lb 3.2 oz (99.4 kg)  Height: 6' (1.829 m)   Body mass index is 29.73 kg/m.  Gen: resting comfortably, no acute distress HEENT: no scleral icterus, pupils equal round and reactive, no palptable cervical adenopathy,  CV: RRR, no m/r/g, no jvd Resp: Clear to auscultation bilaterally GI: abdomen is soft, non-tender, non-distended, normal bowel  sounds, no hepatosplenomegaly MSK: extremities are warm, no edema.  Skin: warm, no rash Neuro:  no focal deficits Psych: appropriate affect   Diagnostic Studies  02/2018 echo Study Conclusions  - Left ventricle: The cavity size was normal. Wall thickness was normal. Systolic function was normal. The estimated ejection fraction was in the range of 55% to 60%. Wall motion was normal; there were no regional wall motion abnormalities. The study is not technically sufficient to allow evaluation of LV diastolic function due to atrial flutter. -  Mitral valve: There was mild regurgitation.    Assessment and Plan  1. Paroxysmal Aflutter - Has not required av nodal agent, when in SR has had low normal rates - continue xarelto   2. HTN - he is at goal, continue current meds  3. Hyperlipidemia - at goal, continue statin  F/u 1 year    Arnoldo Lenis, M.D.

## 2019-07-09 ENCOUNTER — Telehealth: Payer: Self-pay | Admitting: Family Medicine

## 2019-07-09 DIAGNOSIS — E785 Hyperlipidemia, unspecified: Secondary | ICD-10-CM

## 2019-07-09 DIAGNOSIS — Z79899 Other long term (current) drug therapy: Secondary | ICD-10-CM

## 2019-07-09 DIAGNOSIS — I10 Essential (primary) hypertension: Secondary | ICD-10-CM

## 2019-07-09 DIAGNOSIS — E119 Type 2 diabetes mellitus without complications: Secondary | ICD-10-CM

## 2019-07-09 DIAGNOSIS — Z125 Encounter for screening for malignant neoplasm of prostate: Secondary | ICD-10-CM

## 2019-07-09 NOTE — Telephone Encounter (Signed)
Last labs completed on 08/07/18 A1C, Lipid and hepatic. Please advise. Thank you

## 2019-07-09 NOTE — Telephone Encounter (Signed)
Pt is scheduled for physical 08/10/2019, needs labs ordered  Please call pt when orders are ready

## 2019-07-12 NOTE — Telephone Encounter (Signed)
Blood work ordered in Epic. Patient notified. 

## 2019-07-12 NOTE — Telephone Encounter (Signed)
Lip liv m7 A1c psa urine pro

## 2019-07-30 LAB — LIPID PANEL
Chol/HDL Ratio: 2.7 ratio (ref 0.0–5.0)
Cholesterol, Total: 142 mg/dL (ref 100–199)
HDL: 53 mg/dL (ref >39)
LDL Chol Calc (NIH): 81 mg/dL (ref 0–99)
Triglycerides: 32 mg/dL (ref 0–149)
VLDL Cholesterol Cal: 8 mg/dL (ref 5–40)

## 2019-07-30 LAB — HEPATIC FUNCTION PANEL
ALT: 36 IU/L (ref 0–44)
AST: 29 IU/L (ref 0–40)
Albumin: 4.5 g/dL (ref 3.8–4.9)
Alkaline Phosphatase: 46 IU/L (ref 39–117)
Bilirubin Total: 0.6 mg/dL (ref 0.0–1.2)
Bilirubin, Direct: 0.19 mg/dL (ref 0.00–0.40)
Total Protein: 7 g/dL (ref 6.0–8.5)

## 2019-07-30 LAB — BASIC METABOLIC PANEL
BUN/Creatinine Ratio: 16 (ref 9–20)
BUN: 20 mg/dL (ref 6–24)
CO2: 25 mmol/L (ref 20–29)
Calcium: 9.7 mg/dL (ref 8.7–10.2)
Chloride: 103 mmol/L (ref 96–106)
Creatinine, Ser: 1.22 mg/dL (ref 0.76–1.27)
GFR calc Af Amer: 75 mL/min/{1.73_m2} (ref >59)
GFR calc non Af Amer: 64 mL/min/{1.73_m2} (ref >59)
Glucose: 89 mg/dL (ref 65–99)
Potassium: 3.8 mmol/L (ref 3.5–5.2)
Sodium: 141 mmol/L (ref 134–144)

## 2019-07-30 LAB — MICROALBUMIN / CREATININE URINE RATIO
Creatinine, Urine: 217.6 mg/dL
Microalb/Creat Ratio: 6 mg/g creat (ref 0–29)
Microalbumin, Urine: 13.4 ug/mL

## 2019-07-30 LAB — HEMOGLOBIN A1C
Est. average glucose Bld gHb Est-mCnc: 117 mg/dL
Hgb A1c MFr Bld: 5.7 % — ABNORMAL HIGH (ref 4.8–5.6)

## 2019-07-30 LAB — PSA: Prostate Specific Ag, Serum: 0.5 ng/mL (ref 0.0–4.0)

## 2019-08-09 ENCOUNTER — Telehealth: Payer: Self-pay | Admitting: Family Medicine

## 2019-08-09 NOTE — Telephone Encounter (Signed)
ERROR

## 2019-08-10 ENCOUNTER — Other Ambulatory Visit: Payer: Self-pay

## 2019-08-10 ENCOUNTER — Ambulatory Visit: Payer: Managed Care, Other (non HMO) | Admitting: Family Medicine

## 2019-08-10 ENCOUNTER — Encounter: Payer: Self-pay | Admitting: Family Medicine

## 2019-08-10 VITALS — BP 126/82 | Ht 72.0 in | Wt 210.8 lb

## 2019-08-10 DIAGNOSIS — N5201 Erectile dysfunction due to arterial insufficiency: Secondary | ICD-10-CM

## 2019-08-10 DIAGNOSIS — Z0000 Encounter for general adult medical examination without abnormal findings: Secondary | ICD-10-CM

## 2019-08-10 DIAGNOSIS — Z23 Encounter for immunization: Secondary | ICD-10-CM

## 2019-08-10 DIAGNOSIS — E785 Hyperlipidemia, unspecified: Secondary | ICD-10-CM

## 2019-08-10 DIAGNOSIS — E119 Type 2 diabetes mellitus without complications: Secondary | ICD-10-CM

## 2019-08-10 DIAGNOSIS — I10 Essential (primary) hypertension: Secondary | ICD-10-CM

## 2019-08-10 DIAGNOSIS — I4892 Unspecified atrial flutter: Secondary | ICD-10-CM

## 2019-08-10 MED ORDER — AMLODIPINE BESY-BENAZEPRIL HCL 10-20 MG PO CAPS: ORAL_CAPSULE | ORAL | 1 refills | Status: DC

## 2019-08-10 MED ORDER — PRAVASTATIN SODIUM 80 MG PO TABS: 80.0000 mg | ORAL_TABLET | Freq: Every evening | ORAL | 1 refills | Status: DC

## 2019-08-10 MED ORDER — HYDROCHLOROTHIAZIDE 25 MG PO TABS: 25.0000 mg | ORAL_TABLET | Freq: Every day | ORAL | 1 refills | Status: DC

## 2019-08-10 NOTE — Progress Notes (Signed)
Subjective:    Patient ID: Brian Ewing, male    DOB: December 14, 1960, 58 y.o.   MRN: [id number]  HPI  The patient comes in today for a wellness visit.    A review of their health history was completed.  A review of medications was also completed.  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   Additional concerns: none  Results for orders placed or performed in visit on 07/09/19  Lipid panel  Result Value Ref Range   Cholesterol, Total 142 100 - 199 mg/dL   Triglycerides 32 0 - 149 mg/dL   HDL 53 >46 mg/dL   VLDL Cholesterol Cal 8 5 - 40 mg/dL   LDL Chol Calc (NIH) 81 0 - 99 mg/dL   Chol/HDL Ratio 2.7 0.0 - 5.0 ratio  Hepatic function panel  Result Value Ref Range   Total Protein 7.0 6.0 - 8.5 g/dL   Albumin 4.5 3.8 - 4.9 g/dL   Bilirubin Total 0.6 0.0 - 1.2 mg/dL   Bilirubin, Direct 2.70 0.00 - 0.40 mg/dL   Alkaline Phosphatase 46 39 - 117 IU/L   AST 29 0 - 40 IU/L   ALT 36 0 - 44 IU/L  Basic metabolic panel  Result Value Ref Range   Glucose 89 65 - 99 mg/dL   BUN 20 6 - 24 mg/dL   Creatinine, Ser 3.50 0.76 - 1.27 mg/dL   GFR calc non Af Amer 64 >59 mL/min/1.73   GFR calc Af Amer 75 >59 mL/min/1.73   BUN/Creatinine Ratio 16 9 - 20   Sodium 141 134 - 144 mmol/L   Potassium 3.8 3.5 - 5.2 mmol/L   Chloride 103 96 - 106 mmol/L   CO2 25 20 - 29 mmol/L   Calcium 9.7 8.7 - 10.2 mg/dL  Hemoglobin K9F  Result Value Ref Range   Hgb A1c MFr Bld 5.7 (H) 4.8 - 5.6 %   Est. average glucose Bld gHb Est-mCnc 117 mg/dL  PSA  Result Value Ref Range   Prostate Specific Ag, Serum 0.5 0.0 - 4.0 ng/mL  Microalbumin / creatinine urine ratio  Result Value Ref Range   Creatinine, Urine 217.6 Not Estab. mg/dL   Microalbumin, Urine 81.8 Not Estab. ug/mL   Microalb/Creat Ratio 6 0 - 29 mg/g creat    Blood pressure medicine and blood pressure levels reviewed today with patient. Compliant with blood pressure medicine. States does not miss a dose. No obvious side effects. Blood pressure generally good when checked elsewhere. Watching salt intake.   Patient claims compliance with diabetes medication. No obvious side effects. Reports no substantial low sugar spells. Most numbers are generally in good range when checked fasting. Generally does not miss a dose of medication. Watching diabetic diet closely  Patient continues to take lipid medication regularly. No obvious side effects from it. Generally does not miss a dose. Prior blood work results are reviewed with patient. Patient continues to work on fat intake in diet   Review of Systems  Constitutional: Negative for activity change, appetite change and fever.  HENT: Negative for congestion and rhinorrhea.   Eyes: Negative for discharge.  Respiratory: Negative for cough and wheezing.   Cardiovascular: Negative for chest pain.  Gastrointestinal: Negative for abdominal pain, blood in stool and vomiting.  Genitourinary: Negative for difficulty urinating  and frequency.  Musculoskeletal: Negative for neck pain.  Skin: Negative for rash.  Allergic/Immunologic: Negative for environmental allergies and food allergies.  Neurological: Negative for weakness and headaches.  Psychiatric/Behavioral: Negative for agitation.  All other systems reviewed and are negative.      Objective:   Physical Exam Vitals signs reviewed.  Constitutional:      Appearance: He is well-developed.  HENT:     Head: Normocephalic and atraumatic.     Right Ear: External ear normal.     Left Ear: External ear normal.     Nose: Nose normal.  Eyes:     Pupils: Pupils are equal, round, and reactive to light.  Neck:     Musculoskeletal: Normal range of motion and neck supple.     Thyroid: No thyromegaly.  Cardiovascular:     Rate and Rhythm: Normal rate and  regular rhythm.     Heart sounds: Normal heart sounds. No murmur.  Pulmonary:     Effort: Pulmonary effort is normal. No respiratory distress.     Breath sounds: Normal breath sounds. No wheezing.  Abdominal:     General: Bowel sounds are normal. There is no distension.     Palpations: Abdomen is soft. There is no mass.     Tenderness: There is no abdominal tenderness.  Genitourinary:    Penis: Normal.   Musculoskeletal: Normal range of motion.  Lymphadenopathy:     Cervical: No cervical adenopathy.  Skin:    General: Skin is warm and dry.     Findings: No erythema.  Neurological:     Mental Status: He is alert.     Motor: No abnormal muscle tone.  Psychiatric:        Behavior: Behavior normal.        Judgment: Judgment normal.      Diabetic foot exam.  Bilateral strong pulses.  Sensation intact.  No deformities     Assessment & Plan:  Impression 1 wellness exam.  Up-to-date on colonoscopy.  Diet discussed.  Exercise discussed.  Flu shot discussed and administered  2.  Hypertension blood pressure good discussed maintain same meds compliance discussed  3.  Hyperlipidemia parameters good for diabetic.  Discussed to maintain same meds  4.  Type 2 diabetes.  Great control discussed to maintain same efforts  5.  Erectile dysfunction ongoing discussed medication refill  6.  Atrial flutter occasional irregular rhythm noted today.  Rate in good control.  Patient currently on Xarelto and encouraged to maintain  Follow-up in 6 months vaccines today

## 2019-08-10 NOTE — Patient Instructions (Signed)
Results for orders placed or performed in visit on 07/09/19  Lipid panel  Result Value Ref Range   Cholesterol, Total 142 100 - 199 mg/dL   Triglycerides 32 0 - 149 mg/dL   HDL 53 >39 mg/dL   VLDL Cholesterol Cal 8 5 - 40 mg/dL   LDL Chol Calc (NIH) 81 0 - 99 mg/dL   Chol/HDL Ratio 2.7 0.0 - 5.0 ratio  Hepatic function panel  Result Value Ref Range   Total Protein 7.0 6.0 - 8.5 g/dL   Albumin 4.5 3.8 - 4.9 g/dL   Bilirubin Total 0.6 0.0 - 1.2 mg/dL   Bilirubin, Direct 0.19 0.00 - 0.40 mg/dL   Alkaline Phosphatase 46 39 - 117 IU/L   AST 29 0 - 40 IU/L   ALT 36 0 - 44 IU/L  Basic metabolic panel  Result Value Ref Range   Glucose 89 65 - 99 mg/dL   BUN 20 6 - 24 mg/dL   Creatinine, Ser 1.22 0.76 - 1.27 mg/dL   GFR calc non Af Amer 64 >59 mL/min/1.73   GFR calc Af Amer 75 >59 mL/min/1.73   BUN/Creatinine Ratio 16 9 - 20   Sodium 141 134 - 144 mmol/L   Potassium 3.8 3.5 - 5.2 mmol/L   Chloride 103 96 - 106 mmol/L   CO2 25 20 - 29 mmol/L   Calcium 9.7 8.7 - 10.2 mg/dL  Hemoglobin A1c  Result Value Ref Range   Hgb A1c MFr Bld 5.7 (H) 4.8 - 5.6 %   Est. average glucose Bld gHb Est-mCnc 117 mg/dL  PSA  Result Value Ref Range   Prostate Specific Ag, Serum 0.5 0.0 - 4.0 ng/mL  Microalbumin / creatinine urine ratio  Result Value Ref Range   Creatinine, Urine 217.6 Not Estab. mg/dL   Microalbumin, Urine 13.4 Not Estab. ug/mL   Microalb/Creat Ratio 6 0 - 29 mg/g creat

## 2019-09-07 ENCOUNTER — Encounter: Payer: Self-pay | Admitting: Family Medicine

## 2019-09-07 LAB — HM DIABETES EYE EXAM

## 2019-12-15 ENCOUNTER — Encounter: Payer: Self-pay | Admitting: Family Medicine

## 2019-12-16 ENCOUNTER — Ambulatory Visit: Payer: Managed Care, Other (non HMO) | Admitting: Family Medicine

## 2019-12-16 ENCOUNTER — Other Ambulatory Visit: Payer: Self-pay

## 2019-12-16 ENCOUNTER — Encounter: Payer: Self-pay | Admitting: Family Medicine

## 2019-12-16 VITALS — Temp 98.0°F | Ht 72.0 in | Wt 217.8 lb

## 2019-12-16 DIAGNOSIS — M65339 Trigger finger, unspecified middle finger: Secondary | ICD-10-CM | POA: Diagnosis not present

## 2019-12-16 DIAGNOSIS — R202 Paresthesia of skin: Secondary | ICD-10-CM | POA: Diagnosis not present

## 2019-12-16 DIAGNOSIS — G5712 Meralgia paresthetica, left lower limb: Secondary | ICD-10-CM

## 2019-12-16 NOTE — Progress Notes (Signed)
   Subjective:    Patient ID: Brian Ewing, male    DOB: May 01, 1961, 59 y.o.   MRN: [id number]  HPI  Patient arrives with problems with his left hand. Patient states he can not close his middle finger on his left hand. Patient also has sone pain and tingling when he lays down at night.  difficuty flexing finger Middle    Patient notes substantial discomfort in his left hand.  Primarily in the palm at the metacarpal phalangeal joint.  Pain and tenderness.  Unable to completely flex his hand.  This is impacting both on work and home activities  Patient also notes numbness in both hands which occur at night.  He will wake up with it.  Some temporary sense of diminished function but it returns after a few minutes.  Also notes numbness in his left anterior leg.  Occurs with prolonged standing.  Patient is concerned that all these things may be related.  He is worried that he may have some type of neuropathic serious condition  Review of Systems No headache no chest pain no shortness of breath    Objective:   Physical Exam  Alert vitals stable, NAD. Blood pressure good on repeat. HEENT normal. Lungs clear. Heart regular rate and rhythm. Hand difficulty gripping with distinct tenderness at phalangeal metacarpal palmar joint mid finger Hands bilateral negative Phalen's sign.  Negative Tinel's sign.  Grip intact.  Sensation intact  Legs within normal limits     Assessment & Plan:  Impression 1 progressive hand pain with dysfunction of tendons and metacarpal phalangeal joint.  Discussed with patient progressive over a year needs a hand consultation rationale discussed  2.  Either carpal tunnel syndrome or ulnar neuropathy.  Transient.  Does not affect the patient during the daytime.  Not enough to warrant full neuro work-up at this time  3.  Meralgia paresthetica.  Discussed with patient.  Etiology discussed.  Management discussed.  Follow-up as scheduled consultation as noted  symptom care discussed  Greater than 50% of this 25 minute face to face visit was spent in counseling and discussion and coordination of care regarding the above diagnosis/diagnosies

## 2019-12-16 NOTE — Patient Instructions (Signed)
Meralgia paraesthetica  Neuralgia paraesthetica 

## 2019-12-26 ENCOUNTER — Other Ambulatory Visit: Payer: Self-pay

## 2019-12-26 ENCOUNTER — Encounter (HOSPITAL_COMMUNITY): Payer: Self-pay | Admitting: Emergency Medicine

## 2019-12-26 ENCOUNTER — Ambulatory Visit (HOSPITAL_COMMUNITY)
Admission: EM | Admit: 2019-12-26 | Discharge: 2019-12-26 | Disposition: A | Discharge status: Home / Self Care | Payer: Worker's Compensation | Attending: Emergency Medicine | Admitting: Emergency Medicine

## 2019-12-26 DIAGNOSIS — W260XXA Contact with knife, initial encounter: Secondary | ICD-10-CM | POA: Diagnosis not present

## 2019-12-26 DIAGNOSIS — Z042 Encounter for examination and observation following work accident: Secondary | ICD-10-CM

## 2019-12-26 DIAGNOSIS — Z23 Encounter for immunization: Secondary | ICD-10-CM | POA: Diagnosis not present

## 2019-12-26 DIAGNOSIS — S61011A Laceration without foreign body of right thumb without damage to nail, initial encounter: Secondary | ICD-10-CM

## 2019-12-26 MED ORDER — BACITRACIN ZINC 500 UNIT/GM EX OINT
1.0000 "application " | TOPICAL_OINTMENT | Freq: Once | CUTANEOUS | Status: AC
  Administered 2019-12-26: 1 via TOPICAL

## 2019-12-26 MED ORDER — HYDROCODONE-ACETAMINOPHEN 5-325 MG PO TABS: 1.0000 | ORAL_TABLET | Freq: Four times a day (QID) | ORAL | 0 refills | Status: DC | PRN

## 2019-12-26 MED ORDER — LIDOCAINE HCL 2 % IJ SOLN
INTRAMUSCULAR | Status: AC
  Filled 2019-12-26: qty 20

## 2019-12-26 MED ORDER — TETANUS-DIPHTH-ACELL PERTUSSIS 5-2.5-18.5 LF-MCG/0.5 IM SUSP
0.5000 mL | Freq: Once | INTRAMUSCULAR | Status: AC
  Administered 2019-12-26: 0.5 mL via INTRAMUSCULAR

## 2019-12-26 MED ORDER — AMOXICILLIN-POT CLAVULANATE 875-125 MG PO TABS: 1.0000 | ORAL_TABLET | Freq: Two times a day (BID) | ORAL | 0 refills | Status: AC

## 2019-12-26 MED ORDER — TETANUS-DIPHTH-ACELL PERTUSSIS 5-2.5-18.5 LF-MCG/0.5 IM SUSP
INTRAMUSCULAR | Status: AC
  Filled 2019-12-26: qty 0.5

## 2019-12-26 NOTE — Discharge Instructions (Addendum)
Keep this clean and dry for the first 48 to 72 hours.  Then you can take the bandage off.  Give it some dry time so that it can heal.  However keep it covered at all times while you are out and about.  Take a Tylenol containing product 3-4 times a day as needed for pain.  You may take at 1000 mg of Tylenol for mild to moderate pain, 1-2 Norco for severe pain.  Do not take Tylenol and Norco as they both have Tylenol in them and too much Tylenol can hurt your liver.  Do not exceed more than 4 g of Tylenol from all sources in 1 day.  Return here for any signs of infection.  Follow-up with [facility] on [address] in 10 days for suture removal.  Finish the Augmentin.

## 2019-12-26 NOTE — ED Provider Notes (Signed)
HPI  SUBJECTIVE:  Brian Ewing is right-handed a 59 y.o. male who presents with a laceration to his distal right thumb sustained several hours prior to arrival.  Patient was at work, cut his right thumb on a dirty meat cutting knife.  He reports constant burning, achy, dull, stabbing, soreness.  Denies foreign body sensation.  No numbness tingling, limitation of motion of the thumb.  He tried irrigating it out with tap water, applied antibiotic ointment and compression with hemostasis and came here.  There are no other aggravating or alleviating factors.  He has a past medical history of atrial flutter on Xarelto, diabetes, hypertension.  Last tetanus unknown.  PMD: Dr. Rosemary Holms.  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    Past Medical History:  Diagnosis Date  . Diabetes mellitus without complication ([place])   . Elevated hemoglobin A1c   . Hyperlipidemia   . Hypertension     Past Surgical History:  Procedure Laterality Date  . ARTHROSCOPY KNEE W/ DRILLING      Family History  Problem Relation Age of Onset  . Stomach cancer Father        77 when he passed   . Heart disease Father        bypass surgery at some point     Social History   Tobacco Use  . Smoking status: Never Smoker  . Smokeless tobacco: Never Used  Substance Use Topics  . Alcohol use: No    Alcohol/week: 0.0 standard drinks  . Drug use: No     Current Facility-Administered Medications:  .  bacitracin ointment 1 application, 1 application, Topical, Once, Melynda Ripple, MD  Current Outpatient Medications:  .  amLODipine-benazepril (LOTREL) 10-20 MG capsule, TAKE 1 CAPSULE(S) BY MOUTH DAILY, Disp: 90 capsule, Rfl: 1 .  hydrochlorothiazide (HYDRODIURIL) 25 MG tablet, Take 1 tablet (25 mg total) by mouth daily., Disp: 90 tablet, Rfl: 1 .  pravastatin (PRAVACHOL) 80 MG tablet, Take 1 tablet (80 mg total) by mouth every evening., Disp: 90 tablet, Rfl: 1 .  XARELTO 20 MG TABS tablet, TAKE ONE TABLET BY MOUTH  DAILY WITH SUPPER, Disp: 90 tablet, Rfl: 10 .  amoxicillin-clavulanate (AUGMENTIN) 875-125 MG tablet, Take 1 tablet by mouth 2 (two) times daily for 7 days., Disp: 14 tablet, Rfl: 0 .  blood glucose meter kit and supplies, Dispense based on patient and insurance preference. Test sugar once a day., Disp: 1 each, Rfl: 0 .  HYDROcodone-acetaminophen (NORCO/VICODIN) 5-325 MG tablet, Take 1-2 tablets by mouth every 6 (six) hours as needed for moderate pain or severe pain., Disp: 8 tablet, Rfl: 0 .  sildenafil (REVATIO) 20 MG tablet, TAKE TWO AND ONE-HALF TABLETS BY MOUTH AS NEEDED, Disp: 30 tablet, Rfl: 5  No Known Allergies   ROS  As noted in HPI.   Physical Exam  BP 130/73 (BP Location: Left Arm)   Pulse 87   Temp 98.3 F (36.8 C) (Oral)   Resp 18   SpO2 96%   Constitutional: Well developed, well nourished, no acute distress Eyes:  EOMI, conjunctiva normal bilaterally HENT: Normocephalic, atraumatic,mucus membranes moist Respiratory: Normal inspiratory effort Cardiovascular: Normal rate GI: nondistended skin: No rash, skin intact Musculoskeletal: 2 cm clean linear laceration distal phalanx radial aspect right thumb.  Two-point discrimination distally intact.  Wound explored with adequate hemostasis.  No neurovascular involvement, bony exposure or tendon involvement noted.  Flexion extension thumb intact.  No injury to the nail.  Cap refill less than 2 seconds.  Neurologic: Alert & oriented x 3, no focal neuro deficits Psychiatric: Speech and behavior appropriate   ED Course   Medications  bacitracin ointment 1 application (has no administration in time range)  Tdap (BOOSTRIX) injection 0.5 mL (0.5 mLs Intramuscular Given 12/26/19 1829)    Orders Placed This Encounter  Procedures  . Apply dry sterile dressing    Standing Status:   Standing    Number of Occurrences:   1    No results found for this or any previous visit (from the past 24 hour(s)). No results  found.  ED Clinical Impression  1. Laceration of right thumb without foreign body without damage to nail, initial encounter      ED Assessment/Plan  Updating tetanus  [place] Narcotic database reviewed for this patient, and feel that the risk/benefit ratio today is favorable for proceeding with a prescription for controlled substance.  No opiate prescriptions in 2 years.  Procedure note: Had patient soak hand and chlorhexidine/tap water.  Cleaned with alcohol, perform digital block with 1 cc of 2% plain lidocaine with adequate anesthesia.  Using sterile technique, placed four 5-0 interrupted Prolene sutures with close approximation of wound edges.  Bacitracin, bulky dressing then placed.  Patient tolerated procedure well.  Home with Tylenol for mild to moderate pain, short course of Norco for severe pain.  7 days of Augmentin given that it was a contaminated meat knife.  Work note with light duty for 10 days.  Follow-up with occupational health for suture removal, return here sooner for any signs of infection.  Discussed  MDM, treatment plan, and plan for follow-up with patient. Discussed sn/sx that should prompt return to the ED. patient agrees with plan.   Meds ordered this encounter  Medications  . Tdap (BOOSTRIX) injection 0.5 mL  . HYDROcodone-acetaminophen (NORCO/VICODIN) 5-325 MG tablet    Sig: Take 1-2 tablets by mouth every 6 (six) hours as needed for moderate pain or severe pain.    Dispense:  8 tablet    Refill:  0  . amoxicillin-clavulanate (AUGMENTIN) 875-125 MG tablet    Sig: Take 1 tablet by mouth 2 (two) times daily for 7 days.    Dispense:  14 tablet    Refill:  0  . bacitracin ointment 1 application    *This clinic note was created using [employer]. Therefore, there may be occasional mistakes despite careful proofreading.   ?    Melynda Ripple, MD 12/26/19 [phone]

## 2019-12-26 NOTE — ED Triage Notes (Signed)
Laceration to right thumb.  Cut today at work around 5:00 pm   Cut on a knife used for cutting food.  Patient is on blood thinners

## 2020-01-05 ENCOUNTER — Encounter: Payer: Self-pay | Admitting: Family Medicine

## 2020-02-02 ENCOUNTER — Other Ambulatory Visit: Payer: Self-pay | Admitting: *Deleted

## 2020-02-02 ENCOUNTER — Other Ambulatory Visit: Payer: Self-pay | Admitting: Family Medicine

## 2020-02-02 MED ORDER — SILDENAFIL CITRATE 20 MG PO TABS: ORAL_TABLET | ORAL | 4 refills | Status: DC

## 2020-02-04 ENCOUNTER — Telehealth: Payer: Self-pay | Admitting: Family Medicine

## 2020-02-04 DIAGNOSIS — E119 Type 2 diabetes mellitus without complications: Secondary | ICD-10-CM

## 2020-02-04 DIAGNOSIS — Z79899 Other long term (current) drug therapy: Secondary | ICD-10-CM

## 2020-02-04 DIAGNOSIS — E785 Hyperlipidemia, unspecified: Secondary | ICD-10-CM

## 2020-02-04 NOTE — Telephone Encounter (Signed)
Patient is wondering if he needs labs done for his 6 month follow up for 3/29 appointment

## 2020-02-04 NOTE — Telephone Encounter (Signed)
Last labs 07/2019: Micro albumin urine, PSA, HgbA1c, Met 7, Lipid, Liver

## 2020-02-04 NOTE — Telephone Encounter (Signed)
Lip liv aic

## 2020-02-04 NOTE — Telephone Encounter (Signed)
Labs ordered, pt notified. Since his appointment in Monday morning these labs will not be resulted before the appointment so pt will come Monday morning for labs and keep virtual app.

## 2020-02-07 ENCOUNTER — Ambulatory Visit (INDEPENDENT_AMBULATORY_CARE_PROVIDER_SITE_OTHER): Payer: Managed Care, Other (non HMO) | Admitting: Family Medicine

## 2020-02-07 ENCOUNTER — Encounter: Payer: Self-pay | Admitting: Family Medicine

## 2020-02-07 VITALS — BP 118/66 | Ht 73.0 in | Wt 216.8 lb

## 2020-02-07 DIAGNOSIS — E119 Type 2 diabetes mellitus without complications: Secondary | ICD-10-CM

## 2020-02-07 DIAGNOSIS — E785 Hyperlipidemia, unspecified: Secondary | ICD-10-CM

## 2020-02-07 DIAGNOSIS — I10 Essential (primary) hypertension: Secondary | ICD-10-CM | POA: Diagnosis not present

## 2020-02-07 DIAGNOSIS — I4892 Unspecified atrial flutter: Secondary | ICD-10-CM | POA: Diagnosis not present

## 2020-02-07 MED ORDER — HYDROCHLOROTHIAZIDE 25 MG PO TABS: 25.0000 mg | ORAL_TABLET | Freq: Every day | ORAL | 1 refills | Status: DC

## 2020-02-07 MED ORDER — PRAVASTATIN SODIUM 80 MG PO TABS: 80.0000 mg | ORAL_TABLET | Freq: Every evening | ORAL | 1 refills | Status: DC

## 2020-02-07 MED ORDER — RIVAROXABAN 20 MG PO TABS: ORAL_TABLET | ORAL | 1 refills | Status: DC

## 2020-02-07 MED ORDER — AMLODIPINE BESY-BENAZEPRIL HCL 10-20 MG PO CAPS: ORAL_CAPSULE | ORAL | 1 refills | Status: DC

## 2020-02-07 NOTE — Progress Notes (Signed)
   Subjective:  Audio plus video  Patient ID: Brian Ewing, male    DOB: [birth year], 60 y.o.   MRN: [id number]  Hyperlipidemia This is a chronic problem. Compliance problems: active job and yard work for exercise, takes meds every day, tries to eat healthy.    Virtual Visit via Video Note  I connected with Brian Ewing on 02/07/20 at  8:40 AM EDT by a video enabled telemedicine application and verified that I am speaking with the correct person using two identifiers.  Location: Patient: home Provider: office   I discussed the limitations of evaluation and management by telemedicine and the availability of in person appointments. The patient expressed understanding and agreed to proceed.  History of Present Illness:    Observations/Objective:   Assessment and Plan:   Follow Up Instructions:    I discussed the assessment and treatment plan with the patient. The patient was provided an opportunity to ask questions and all were answered. The patient agreed with the plan and demonstrated an understanding of the instructions.   The patient was advised to call back or seek an in-person evaluation if the symptoms worsen or if the condition fails to improve as anticipated.  I provided 25 minutes of non-face-to-face time during this encounter.   Blood pressure medicine and blood pressure levels reviewed today with patient. Compliant with blood pressure medicine. States does not miss a dose. No obvious side effects. Blood pressure generally good when checked elsewhere. Watching salt intake.  Patient claims compliance with diabetes medication. No obvious side effects. Reports no substantial low sugar spells. Most numbers are generally in good range when checked fasting. Generally does not miss a dose of medication. Watching diabetic diet closely  Patient continues to take lipid medication regularly. No obvious side effects from it. Generally does not miss a dose. Prior blood work  results are reviewed with patient. Patient continues to work on fat intake in diet     Review of Systems No headache no chest pain no shortness of breath    Objective:   Physical Exam  Virtual      Assessment & Plan:  Impression 1 type 2 diabetes.  Fasting sugar is good.  Compliant with diet.  Await A1c.  2.  Hyperlipidemia.  Ongoing await blood work compliance discussed diet discussed  3.  Hypertension.  Blood pressure excellent today discussed to maintain same dose  4.  Atrial flutter.  Patient aware Xarelto reduces his risk of life threatening stroke.  To maintain compliance discussed and encouraged  Appropriate blood work ordered.  Diet exercise discussed.  Fortunately has had his first Covid shot.  Follow-up in 6 months

## 2020-02-08 LAB — HEPATIC FUNCTION PANEL
ALT: 38 IU/L (ref 0–44)
AST: 37 IU/L (ref 0–40)
Albumin: 4.4 g/dL (ref 3.8–4.9)
Alkaline Phosphatase: 47 IU/L (ref 39–117)
Bilirubin Total: 0.5 mg/dL (ref 0.0–1.2)
Bilirubin, Direct: 0.16 mg/dL (ref 0.00–0.40)
Total Protein: 7.3 g/dL (ref 6.0–8.5)

## 2020-02-08 LAB — LIPID PANEL
Chol/HDL Ratio: 2.6 ratio (ref 0.0–5.0)
Cholesterol, Total: 143 mg/dL (ref 100–199)
HDL: 56 mg/dL (ref >39)
LDL Chol Calc (NIH): 79 mg/dL (ref 0–99)
Triglycerides: 31 mg/dL (ref 0–149)
VLDL Cholesterol Cal: 8 mg/dL (ref 5–40)

## 2020-02-08 LAB — HEMOGLOBIN A1C
Est. average glucose Bld gHb Est-mCnc: 126 mg/dL
Hgb A1c MFr Bld: 6 % — ABNORMAL HIGH (ref 4.8–5.6)

## 2020-02-13 ENCOUNTER — Encounter: Payer: Self-pay | Admitting: Family Medicine

## 2020-03-17 ENCOUNTER — Other Ambulatory Visit: Payer: Self-pay | Admitting: Family Medicine

## 2020-05-01 NOTE — Progress Notes (Addendum)
Cardiology Office Note  Date: 05/02/2020   ID: Brian, Ewing October 10, 1961, MRN [id number]  PCP:  Mikey Kirschner, MD  Cardiologist:  Carlyle Dolly, MD Electrophysiologist:  None   Chief Complaint: Follow-up atrial flutter  History of Present Illness: Brian Ewing is a 59 y.o. male with a history of paroxysmal atrial flutter (CHA2DS2-VASc score 2): (Hypertension, DM 2), HTN, HLD.  Last encounter 05/10/2019 with Dr. Harl Bowie.  He was having no recent palpitations and was compliant with his medications.  Last lipid profile 07/31/2018 TC 143, TG 28, HDL 55, LDL 82.  Compliant with statin medication.  He has not required AV nodal agent.  When in sinus rhythm he had low normal rates.  He was continuing Xarelto.  Blood pressure was at goal and was continuing current antihypertensive medications.  He was continuing statin medication.  States he has been doing well since last visit.  Denies any recent acute illnesses, hospitalizations.  Denies any sensation of palpitations, racing heart, or fluttering sensation/palpitations/arrhythmias.  Denies any orthostatic symptoms, stroke or TIA-like symptoms, PND or orthopnea, bleeding issues, claudication-like symptoms, DVT or PE-like symptoms, or lower extremity edema.   Past Medical History:  Diagnosis Date  . Diabetes mellitus without complication ([place])   . Elevated hemoglobin A1c   . Hyperlipidemia   . Hypertension     Past Surgical History:  Procedure Laterality Date  . ARTHROSCOPY KNEE W/ DRILLING      Current Outpatient Medications  Medication Sig Dispense Refill  . amLODipine-benazepril (LOTREL) 10-20 MG capsule TAKE ONE CAPSULE BY MOUTH DAILY 90 capsule 0  . blood glucose meter kit and supplies Dispense based on patient and insurance preference. Test sugar once a day. 1 each 0  . hydrochlorothiazide (HYDRODIURIL) 25 MG tablet TAKE ONE TABLET BY MOUTH DAILY 90 tablet 0  . HYDROcodone-acetaminophen (NORCO/VICODIN) 5-325 MG  tablet Take 1-2 tablets by mouth every 6 (six) hours as needed for moderate pain or severe pain. 8 tablet 0  . pravastatin (PRAVACHOL) 80 MG tablet TAKE ONE TABLET BY MOUTH EVERY EVENING 90 tablet 0  . sildenafil (REVATIO) 20 MG tablet TAKE TWO AND ONE-HALF (50 MG TOTAL) TABLETS BY MOUTH AS NEEDED DAILY 30 tablet 4  . XARELTO 20 MG TABS tablet TAKE ONE TABLET BY MOUTH DAILY WITH SUPPER 90 tablet 9   No current facility-administered medications for this visit.   Allergies:  Patient has no known allergies.   Social History: The patient  reports that he has never smoked. He has never used smokeless tobacco. He reports that he does not drink alcohol and does not use drugs.   Family History: The patient's family history includes Heart disease in his father; Stomach cancer in his father.   ROS:  Please see the history of present illness. Otherwise, complete review of systems is positive for none.  All other systems are reviewed and negative.   Physical Exam: VS:  BP 110/62   Pulse 70   Ht 6' 1"  (1.854 m)   Wt 219 lb (99.3 kg)   SpO2 97%   BMI 28.89 kg/m , BMI Body mass index is 28.89 kg/m.  Wt Readings from Last 3 Encounters:  05/02/20 219 lb (99.3 kg)  02/07/20 216 lb 12.8 oz (98.3 kg)  12/16/19 217 lb 12.8 oz (98.8 kg)    General: Patient appears comfortable at rest. Neck: Supple, no elevated JVP or carotid bruits, no thyromegaly. Lungs: Clear to auscultation, nonlabored breathing at rest. Cardiac: Regular rate and  rhythm, no S3 or significant systolic murmur, no pericardial rub. Extremities: No pitting edema, distal pulses 2+. Skin: Warm and dry. Musculoskeletal: No kyphosis. Neuropsychiatric: Alert and oriented x3, affect grossly appropriate.  ECG:  An ECG dated 05/02/2020 was personally reviewed today and demonstrated:  Sinus rhythm with marked sinus arrhythmia with premature atrial complexes, possible left atrial enlargement, septal infarct age undetermined, prolonged QT QT/QTc  486/520 ms  Recent Labwork: 07/29/2019: BUN 20; Creatinine, Ser 1.22; Potassium 3.8; Sodium 141 02/07/2020: ALT 38; AST 37     Component Value Date/Time   CHOL 143 02/07/2020 0948   TRIG 31 02/07/2020 0948   HDL 56 02/07/2020 0948   CHOLHDL 2.6 02/07/2020 0948   CHOLHDL 2.3 01/07/2015 0920   VLDL 6 01/07/2015 0920   LDLCALC 79 02/07/2020 0948    Other Studies Reviewed Today:   02/2018 echo Study Conclusions  - Left ventricle: The cavity size was normal. Wall thickness was normal. Systolic function was normal. The estimated ejection fraction was in the range of 55% to 60%. Wall motion was normal; there were no regional wall motion abnormalities. The study is not technically sufficient to allow evaluation of LV diastolic function due to atrial flutter. - Mitral valve: There was mild regurgitation.    Assessment and Plan:  1. Paroxysmal atrial fibrillation (HCC)   2. Essential hypertension   3. Mixed hyperlipidemia    1. Paroxysmal atrial fibrillation (HCC) Denies any palpitations or arrhythmias, racing heart, skipped beats.  EKG today shows sinus rhythm with marked sinus arrhythmia with premature atrial complexes, possible left atrial enlargement, septal infarct, age undetermined, prolonged QT with QT/QTc 46/520 ms.  Continue Xarelto 20 mg daily.  2. Essential hypertension Patient is normotensive today with a blood pressure of 110/62, heart rate of 70.  Continue HCTZ 25 mg daily.  Amlodipine/benazepril 10/20 mg daily  3. Mixed hyperlipidemia Lipid panel 02/07/2020: TC 143, Trig 31, HDL 56, LDL 79.  Continue pravastatin 80 mg daily.  Medication Adjustments/Labs and Tests Ordered: Current medicines are reviewed at length with the patient today.  Concerns regarding medicines are outlined above.   Disposition: Follow-up with Dr. Harl Bowie or APP 1 year  Signed, Levell July, NP 05/02/2020 8:29 AM    [facility] [place], [place], [place] [address] Phone: [phone]; Fax: [phone]

## 2020-05-02 ENCOUNTER — Encounter: Payer: Self-pay | Admitting: Family Medicine

## 2020-05-02 ENCOUNTER — Ambulatory Visit (INDEPENDENT_AMBULATORY_CARE_PROVIDER_SITE_OTHER): Payer: Managed Care, Other (non HMO) | Admitting: Family Medicine

## 2020-05-02 ENCOUNTER — Other Ambulatory Visit: Payer: Self-pay

## 2020-05-02 VITALS — BP 110/62 | HR 70 | Ht 73.0 in | Wt 219.0 lb

## 2020-05-02 DIAGNOSIS — I10 Essential (primary) hypertension: Secondary | ICD-10-CM | POA: Diagnosis not present

## 2020-05-02 DIAGNOSIS — I480 Paroxysmal atrial fibrillation: Secondary | ICD-10-CM

## 2020-05-02 DIAGNOSIS — E782 Mixed hyperlipidemia: Secondary | ICD-10-CM | POA: Diagnosis not present

## 2020-05-02 NOTE — Patient Instructions (Addendum)

## 2020-06-02 ENCOUNTER — Encounter (HOSPITAL_COMMUNITY): Payer: Self-pay

## 2020-06-02 ENCOUNTER — Emergency Department (HOSPITAL_COMMUNITY)
Admission: EM | Admit: 2020-06-02 | Discharge: 2020-06-02 | Disposition: A | Discharge status: Home / Self Care | Payer: Managed Care, Other (non HMO) | Attending: Emergency Medicine | Admitting: Emergency Medicine

## 2020-06-02 ENCOUNTER — Other Ambulatory Visit: Payer: Self-pay

## 2020-06-02 ENCOUNTER — Ambulatory Visit
Admission: EM | Admit: 2020-06-02 | Discharge: 2020-06-02 | Disposition: A | Discharge status: Home / Self Care | Payer: Managed Care, Other (non HMO) | Source: Home / Self Care

## 2020-06-02 DIAGNOSIS — I10 Essential (primary) hypertension: Secondary | ICD-10-CM | POA: Diagnosis not present

## 2020-06-02 DIAGNOSIS — E876 Hypokalemia: Secondary | ICD-10-CM

## 2020-06-02 DIAGNOSIS — I480 Paroxysmal atrial fibrillation: Secondary | ICD-10-CM

## 2020-06-02 DIAGNOSIS — Z79899 Other long term (current) drug therapy: Secondary | ICD-10-CM | POA: Insufficient documentation

## 2020-06-02 DIAGNOSIS — R197 Diarrhea, unspecified: Secondary | ICD-10-CM | POA: Diagnosis not present

## 2020-06-02 DIAGNOSIS — R531 Weakness: Secondary | ICD-10-CM | POA: Diagnosis present

## 2020-06-02 DIAGNOSIS — E119 Type 2 diabetes mellitus without complications: Secondary | ICD-10-CM | POA: Diagnosis not present

## 2020-06-02 LAB — URINALYSIS, ROUTINE W REFLEX MICROSCOPIC
Bacteria, UA: NONE SEEN
Bilirubin Urine: NEGATIVE
Glucose, UA: NEGATIVE mg/dL
Ketones, ur: NEGATIVE mg/dL
Leukocytes,Ua: NEGATIVE
Nitrite: NEGATIVE
Protein, ur: NEGATIVE mg/dL
Specific Gravity, Urine: 1.017 (ref 1.005–1.030)
pH: 5 (ref 5.0–8.0)

## 2020-06-02 LAB — COMPREHENSIVE METABOLIC PANEL
ALT: 35 U/L (ref 0–44)
AST: 37 U/L (ref 15–41)
Albumin: 4.3 g/dL (ref 3.5–5.0)
Alkaline Phosphatase: 43 U/L (ref 38–126)
Anion gap: 12 (ref 5–15)
BUN: 24 mg/dL — ABNORMAL HIGH (ref 6–20)
CO2: 21 mmol/L — ABNORMAL LOW (ref 22–32)
Calcium: 8.8 mg/dL — ABNORMAL LOW (ref 8.9–10.3)
Chloride: 102 mmol/L (ref 98–111)
Creatinine, Ser: 1.35 mg/dL — ABNORMAL HIGH (ref 0.61–1.24)
GFR calc Af Amer: >60 mL/min (ref >60)
GFR calc non Af Amer: 57 mL/min — ABNORMAL LOW (ref >60)
Glucose, Bld: 108 mg/dL — ABNORMAL HIGH (ref 70–99)
Potassium: 3.1 mmol/L — ABNORMAL LOW (ref 3.5–5.1)
Sodium: 135 mmol/L (ref 135–145)
Total Bilirubin: 0.6 mg/dL (ref 0.3–1.2)
Total Protein: 8.2 g/dL — ABNORMAL HIGH (ref 6.5–8.1)

## 2020-06-02 LAB — CBC
HCT: 48.4 % (ref 39.0–52.0)
Hemoglobin: 16.8 g/dL (ref 13.0–17.0)
MCH: 31.3 pg (ref 26.0–34.0)
MCHC: 34.7 g/dL (ref 30.0–36.0)
MCV: 90.3 fL (ref 80.0–100.0)
Platelets: 212 10*3/uL (ref 150–400)
RBC: 5.36 MIL/uL (ref 4.22–5.81)
RDW: 13 % (ref 11.5–15.5)
WBC: 3.8 10*3/uL — ABNORMAL LOW (ref 4.0–10.5)
nRBC: 0 % (ref 0.0–0.2)

## 2020-06-02 LAB — LIPASE, BLOOD: Lipase: 38 U/L (ref 11–51)

## 2020-06-02 MED ORDER — ONDANSETRON HCL 4 MG PO TABS: 4.0000 mg | ORAL_TABLET | Freq: Three times a day (TID) | ORAL | 0 refills | Status: DC | PRN

## 2020-06-02 MED ORDER — METOPROLOL TARTRATE 5 MG/5ML IV SOLN
5.0000 mg | Freq: Once | INTRAVENOUS | Status: AC
  Administered 2020-06-02: 5 mg via INTRAVENOUS
  Filled 2020-06-02: qty 5

## 2020-06-02 MED ORDER — SODIUM CHLORIDE 0.9 % IV SOLN: INTRAVENOUS | Status: DC

## 2020-06-02 MED ORDER — POTASSIUM CHLORIDE 10 MEQ/100ML IV SOLN
10.0000 meq | Freq: Once | INTRAVENOUS | Status: AC
  Administered 2020-06-02: 10 meq via INTRAVENOUS
  Filled 2020-06-02: qty 100

## 2020-06-02 MED ORDER — MAGNESIUM SULFATE 2 GM/50ML IV SOLN
2.0000 g | INTRAVENOUS | Status: AC
  Administered 2020-06-02: 2 g via INTRAVENOUS
  Filled 2020-06-02: qty 50

## 2020-06-02 MED ORDER — SODIUM CHLORIDE 0.9 % IV BOLUS
1000.0000 mL | Freq: Once | INTRAVENOUS | Status: AC
  Administered 2020-06-02: 1000 mL via INTRAVENOUS

## 2020-06-02 NOTE — ED Triage Notes (Signed)
Pt to er, pt states that since thursday he has been having some weakness and diarrhea.  States that he feels generally fatigued,  Pt states that he has had about 10 episodes of diarrhea a day.  Denies any unilateral weakness or chest pain.

## 2020-06-02 NOTE — ED Notes (Signed)
Patient is being discharged from the Urgent Care and sent to the Emergency Department via pov . Per B. Wurst, patient is in need of higher level of care due to hypotension. Patient is aware and verbalizes understanding of plan of care.  Vitals:   06/02/20 1730  BP: (!) 98/61  Pulse: 76  Resp: 18  Temp: 98.6 F (37 C)  SpO2: 96%

## 2020-06-02 NOTE — ED Triage Notes (Signed)
Body aches, abd pain, diarrhea and fatigue since Thursday morning. Pt reports having 10 lose stools in the past 24 hours.

## 2020-06-02 NOTE — ED Provider Notes (Signed)
[facility] EMERGENCY DEPARTMENT Provider Note   CSN: [id number] Arrival date & time: 06/02/20  1743     History Chief Complaint  Patient presents with  . Weakness  . Diarrhea    Brian Ewing is a 59 y.o. male.  HPI   This patient is a 59 year old male, known history of diabetes, history of hyperlipidemia and hypertension as well as a history of paroxysmal atrial fibrillation.  I have personally reviewed the patient's medical record at length including his most recent cardiology visit from 1 month ago, he has been treated for paroxysmal atrial fibrillation with Xarelto, he also takes pravastatin, amlodipine benazepril and hydrochlorothiazide.  He presents to the hospital today with a complaint of generalized weakness.  This started yesterday morning, he felt like he had to take a few minutes to get out of the car because of his generalized weakness, then last night he started to have watery diarrhea and has had no less than 10 episodes of voluminous watery diarrhea since that time.  The patient denies focal weakness, denies fevers or chills, denies nausea or vomiting, he has not had any significant abdominal pain just some intermittent cramping.  He has been able to eat a small amount of food this morning but not very much today.  He does have a little lightheadedness when he stands.  He denies chest pain, palpitations, shortness of breath, tick bites, rashes, swelling, headache, blurred vision or photosensitivity.  He has never had anything like this in the past.  Of note they have been watching their grandchild last week which had the same symptoms.  Past Medical History:  Diagnosis Date  . Diabetes mellitus without complication (HCC)   . Elevated hemoglobin A1c   . Hyperlipidemia   . Hypertension     Patient Active Problem List   Diagnosis Date Noted  . Diabetes mellitus without complication (HCC) 06/27/2014  . Erectile dysfunction 10/20/2013  . Hypertension 01/29/2013    . Hyperlipidemia LDL goal <100 01/29/2013  . Obesity, unspecified 01/29/2013    Past Surgical History:  Procedure Laterality Date  . ARTHROSCOPY KNEE W/ DRILLING         Family History  Problem Relation Age of Onset  . Stomach cancer Father        [id number] when he passed   . Heart disease Father        bypass surgery at some point     Social History   Tobacco Use  . Smoking status: Never Smoker  . Smokeless tobacco: Never Used  Substance Use Topics  . Alcohol use: No    Alcohol/week: 0.0 standard drinks  . Drug use: No    Home Medications Prior to Admission medications   Medication Sig Start Date End Date Taking? Authorizing Provider  acetaminophen (TYLENOL) 500 MG tablet Take 500-1,000 mg by mouth every 6 (six) hours as needed for mild pain or moderate pain.   Yes [provider]  amLODipine-benazepril (LOTREL) 10-20 MG capsule TAKE ONE CAPSULE BY MOUTH DAILY Patient taking differently: Take 1 capsule by mouth daily. TAKE ONE CAPSULE BY MOUTH DAILY 03/17/20  Yes Merlyn AlbertLuking, William S, MD  hydrochlorothiazide (HYDRODIURIL) 25 MG tablet TAKE ONE TABLET BY MOUTH DAILY Patient taking differently: Take 25 mg by mouth daily.  03/17/20  Yes Merlyn AlbertLuking, William S, MD  pravastatin (PRAVACHOL) 80 MG tablet TAKE ONE TABLET BY MOUTH EVERY EVENING Patient taking differently: Take 80 mg by mouth in the morning.  03/17/20  Yes Merlyn AlbertLuking, William S, MD  sildenafil (REVATIO) 20 MG tablet TAKE TWO AND ONE-HALF (50 MG TOTAL) TABLETS BY MOUTH AS NEEDED DAILY Patient taking differently: Take 50 mg by mouth daily as needed.  02/02/20  Yes Merlyn Albert, MD  XARELTO 20 MG TABS tablet TAKE ONE TABLET BY MOUTH DAILY WITH SUPPER Patient taking differently: Take 20 mg by mouth in the morning.  03/17/20  Yes Merlyn Albert, MD  ondansetron (ZOFRAN) 4 MG tablet Take 1 tablet (4 mg total) by mouth every 8 (eight) hours as needed for nausea or vomiting. 06/02/20   Eber Hong, MD    Allergies    Patient  has no known allergies.  Review of Systems   Review of Systems  All other systems reviewed and are negative.   Physical Exam Updated Vital Signs BP (!) 111/86   Pulse 81   Temp 98.5 F (36.9 C) (Oral)   Resp 21   Ht 1.854 m (6\' 1" )   Wt (!) 97.5 kg   SpO2 98%   BMI 28.37 kg/m   Physical Exam Vitals and nursing note reviewed.  Constitutional:      General: He is not in acute distress.    Appearance: He is well-developed.  HENT:     Head: Normocephalic and atraumatic.     Mouth/Throat:     Pharynx: No oropharyngeal exudate.  Eyes:     General: No scleral icterus.       Right eye: No discharge.        Left eye: No discharge.     Conjunctiva/sclera: Conjunctivae normal.     Pupils: Pupils are equal, round, and reactive to light.  Neck:     Thyroid: No thyromegaly.     Vascular: No JVD.  Cardiovascular:     Rate and Rhythm: Tachycardia present. Rhythm irregular.     Heart sounds: Normal heart sounds. No murmur heard.  No friction rub. No gallop.      Comments: The patient has a irregularly irregular rhythm Pulmonary:     Effort: Pulmonary effort is normal. No respiratory distress.     Breath sounds: Normal breath sounds. No wheezing or rales.  Abdominal:     General: Bowel sounds are normal. There is no distension.     Palpations: Abdomen is soft. There is no mass.     Tenderness: There is no abdominal tenderness.  Musculoskeletal:        General: No tenderness. Normal range of motion.     Cervical back: Normal range of motion and neck supple.  Lymphadenopathy:     Cervical: No cervical adenopathy.  Skin:    General: Skin is warm and dry.     Findings: No erythema or rash.  Neurological:     Mental Status: He is alert.     Coordination: Coordination normal.     Comments: Normal speech, normal gait, normal strength in all 4 extremities, no facial droop, cranial nerves III through XII are normal  Psychiatric:        Behavior: Behavior normal.     ED Results  / Procedures / Treatments   Labs (all labs ordered are listed, but only abnormal results are displayed) Labs Reviewed  COMPREHENSIVE METABOLIC PANEL - Abnormal; Notable for the following components:      Result Value   Potassium 3.1 (*)    CO2 21 (*)    Glucose, Bld 108 (*)    BUN 24 (*)    Creatinine, Ser 1.35 (*)    Calcium 8.8 (*)  Total Protein 8.2 (*)    GFR calc non Af Amer 57 (*)    All other components within normal limits  CBC - Abnormal; Notable for the following components:   WBC 3.8 (*)    All other components within normal limits  URINALYSIS, ROUTINE W REFLEX MICROSCOPIC - Abnormal; Notable for the following components:   Hgb urine dipstick MODERATE (*)    All other components within normal limits  LIPASE, BLOOD    EKG EKG Interpretation  Date/Time:  Friday June 02 2020 17:55:39 EDT Ventricular Rate:  149 PR Interval:    QRS Duration: 78 QT Interval:  328 QTC Calculation: 516 R Axis:   14 Text Interpretation: Undetermined rhythm T wave abnormality, consider inferior ischemia Abnormal ECG No old tracing to compare Confirmed by Eber Hong ([id number]) on 06/02/2020 6:17:29 PM   EKG Interpretation  Date/Time:  Friday June 02 2020 21:52:02 EDT Ventricular Rate:  88 PR Interval:    QRS Duration: 114 QT Interval:  328 QTC Calculation: 376 R Axis:   15 Text Interpretation: Atrial fibrillation Borderline intraventricular conduction delay Nonspecific T abnormalities, diffuse leads Since last tracing rate slower Confirmed by Eber Hong ([id number]) on 06/02/2020 10:12:00 PM        Radiology No results found.  Procedures Procedures (including critical care time)  Medications Ordered in ED Medications  0.9 %  sodium chloride infusion (0 mLs Intravenous Stopped 06/02/20 2256)  sodium chloride 0.9 % bolus 1,000 mL (0 mLs Intravenous Stopped 06/02/20 2046)  magnesium sulfate IVPB 2 g 50 mL (0 g Intravenous Stopped 06/02/20 2047)  potassium chloride 10 mEq in 100  mL IVPB (0 mEq Intravenous Stopped 06/02/20 2047)  metoprolol tartrate (LOPRESSOR) injection 5 mg (5 mg Intravenous Given 06/02/20 2155)    ED Course  I have reviewed the triage vital signs and the nursing notes.  Pertinent labs & imaging results that were available during my care of the patient were reviewed by me and considered in my medical decision making (see chart for details).    MDM Rules/Calculators/A&P                          The initial EKG showed atrial fibrillation with a rapid ventricular rate with a pulse in the 150's.  The patient had come from urgent care because of generalized weakness and the rapid heart rate.  Without any medications the patient is in the low 100s and during my exam the patient does go back and forth between sinus rhythm and atrial fibrillation.  I suspect he is dehydrated, his blood pressure slightly low at 105/79.  I will give him IV fluids check labs and discuss with cardiology as I do not know that cardioverting when the patient has an acute dehydration illnesses the right thing to do.  Especially with movement back, and forth between afib and NSR  The patient was given Lopressor 5 mg IV, magnesium sulfate replacement, potassium replacement and a bag of IV fluids.  He feels better but continues to have intermittent diarrhea.  His heart rate has reduced and he is now in the 80s, he still is in atrial fibrillation.  I have offered the patient admission to the hospital for ongoing hydration and treatment of his atrial fibrillation, after discussion of the risk benefits and alternatives the patient would rather go home.  I think this is a reasonable choice given his stability, I will give him a to go pack for Zofran,  encouraged him to use Imodium should the diarrhea continue and encouraged him to follow-up with his heart doctor on Monday.  He understands that he should come back should his symptoms worsen including fast heart rate increasing pain nausea or any  other severe or worsening symptoms.  Final Clinical Impression(s) / ED Diagnoses Final diagnoses:  Diarrhea, unspecified type  Hypokalemia  Paroxysmal atrial fibrillation (HCC)    Rx / DC Orders ED Discharge Orders         Ordered    ondansetron (ZOFRAN) 4 MG tablet  Every 8 hours PRN     Discontinue  Reprint     06/02/20 2303           Eber Hong, MD 06/02/20 2306

## 2020-06-02 NOTE — Discharge Instructions (Signed)
Your testing tonight shows that your potassium was low, this was likely because of the diarrhea Please take plenty of fluids over the next 2 days, rest at home, do not go to work, see your doctor on Monday preferably your heart doctor but you may need to see your family doctor if you cannot see your heart doctor Please return to the emergency department for any severe or worsening symptoms including increasing pain, fever, vomiting or worsening diarrhea. Please take all of your atrial fibrillation medications, especially your Xarelto If you find that you are having fast heart rates based on your home measurements of blood pressure or heart rate such as anything higher than 100 for your heart rate consistently please come back to the hospital.  Especially if you are having increasing shortness of breath, chest pain, nausea, palpitations or any other severe or worsening symptoms

## 2020-06-02 NOTE — ED Notes (Signed)
Updated on poc. Gave pt urinal for sample

## 2020-06-05 ENCOUNTER — Telehealth: Payer: Self-pay | Admitting: Family Medicine

## 2020-06-05 NOTE — Progress Notes (Signed)
Cardiology Office Note    Date:  06/06/2020   ID:  Brian Ewing, DOB 1961/08/14, MRN [id number]  PCP:  Merlyn AlbertLuking, William S, MD (Inactive)  Cardiologist: Dina RichBranch, Jonathan, MD    Chief Complaint  Patient presents with  . Follow-up    recent Emergency Dept visit    History of Present Illness:    [name] is a 59 y.o. male with past medical history of paroxysmal atrial flutter, HTN and HLD who presents to the office today for follow-up from a recent Emergency Department visit.   He was recently examined by Nena PolioAndy Quinn, NP on 05/02/2020 and denied any recent chest pain or palpitations at that time. He was continued on his current medication regimen, including Xarelto for anticoagulation.   In the interim, he presented to [facility] ED on 06/02/2020 for evaluation of abdominal pain and frequent diarrhea. Initial EKG showed a narrow complex tachycardia with HR at 149 and he was given IVF due to concern for dehydration and IV Lopressor. By review of the EDP notes, he was alternating between sinus rhythm and atrial fibrillation during admission. Repeat EKG did show atrial flutter, HR 88. Lab work showed WBC 3.8, Hgb 16.8, platelets 212, Na+ 135, K+ 3.1 and creatinine 1.35 (previous baseline ~ 1.2). Was informed to follow-up with Cardiology as an outpatient.   In talking with the patient today, he reports he was overall asymptomatic in regards to palpitations at the time of his recent ED evaluation. He had been feeling weak over the past few days leading up to this and thought his symptoms were secondary to diarrhea as he been having 10+ episodes a day. Reports his nephew had been sick as well and had been staying with the patient and his wife during that timeframe.  Since his ED visit, he has been monitoring his vitals at home and reports his heart rate has overall been well controlled in the 40's to 80's. He denies any palpitations or chest pain. No recent dyspnea on exertion, orthopnea, PND,  or lower extremity edema.   Past Medical History:  Diagnosis Date  . Atrial flutter (HCC)   . Diabetes mellitus without complication (HCC)   . Elevated hemoglobin A1c   . Hyperlipidemia   . Hypertension     Past Surgical History:  Procedure Laterality Date  . ARTHROSCOPY KNEE W/ DRILLING      Current Medications: Outpatient Medications Prior to Visit  Medication Sig Dispense Refill  . acetaminophen (TYLENOL) 500 MG tablet Take 500-1,000 mg by mouth every 6 (six) hours as needed for mild pain or moderate pain.    Marland Kitchen. amLODipine-benazepril (LOTREL) 10-20 MG capsule TAKE ONE CAPSULE BY MOUTH DAILY (Patient taking differently: Take 1 capsule by mouth daily. TAKE ONE CAPSULE BY MOUTH DAILY) 90 capsule 0  . hydrochlorothiazide (HYDRODIURIL) 25 MG tablet TAKE ONE TABLET BY MOUTH DAILY (Patient taking differently: Take 25 mg by mouth daily. ) 90 tablet 0  . ondansetron (ZOFRAN) 4 MG tablet Take 1 tablet (4 mg total) by mouth every 8 (eight) hours as needed for nausea or vomiting. 4 tablet 0  . pravastatin (PRAVACHOL) 80 MG tablet TAKE ONE TABLET BY MOUTH EVERY EVENING (Patient taking differently: Take 80 mg by mouth in the morning. ) 90 tablet 0  . sildenafil (REVATIO) 20 MG tablet TAKE TWO AND ONE-HALF (50 MG TOTAL) TABLETS BY MOUTH AS NEEDED DAILY (Patient taking differently: Take 50 mg by mouth daily as needed. ) 30 tablet 4  .  XARELTO 20 MG TABS tablet TAKE ONE TABLET BY MOUTH DAILY WITH SUPPER (Patient taking differently: Take 20 mg by mouth in the morning. ) 90 tablet 9   No facility-administered medications prior to visit.     Allergies:   Patient has no known allergies.   Social History   Socioeconomic History  . Marital status: Married    Spouse name: Not on file  . Number of children: Not on file  . Years of education: Not on file  . Highest education level: Not on file  Occupational History  . Not on file  Tobacco Use  . Smoking status: Never Smoker  . Smokeless tobacco:  Never Used  Substance and Sexual Activity  . Alcohol use: No    Alcohol/week: 0.0 standard drinks  . Drug use: No  . Sexual activity: Not on file  Other Topics Concern  . Not on file  Social History Narrative  . Not on file   Social Determinants of Health   Financial Resource Strain:   . Difficulty of Paying Living Expenses:   Food Insecurity:   . Worried About Programme researcher, broadcasting/film/video in the Last Year:   . Barista in the Last Year:   Transportation Needs:   . Freight forwarder (Medical):   Marland Kitchen Lack of Transportation (Non-Medical):   Physical Activity:   . Days of Exercise per Week:   . Minutes of Exercise per Session:   Stress:   . Feeling of Stress :   Social Connections:   . Frequency of Communication with Friends and Family:   . Frequency of Social Gatherings with Friends and Family:   . Attends Religious Services:   . Active Member of Clubs or Organizations:   . Attends Banker Meetings:   Marland Kitchen Marital Status:      Family History:  The patient's family history includes Heart disease in his father; Stomach cancer in his father.   Review of Systems:   Please see the history of present illness.     General:  No chills, fever, night sweats or weight changes.  Cardiovascular:  No chest pain, dyspnea on exertion, edema, orthopnea, palpitations, paroxysmal nocturnal dyspnea. Dermatological: No rash, lesions/masses Respiratory: No cough, dyspnea Urologic: No hematuria, dysuria Abdominal:   No nausea, vomiting, bright red blood per rectum, melena, or hematemesis. Positive for diarrhea (improved).  Neurologic:  No visual changes, wkns, changes in mental status. All other systems reviewed and are otherwise negative except as noted above.   Physical Exam:    VS:  BP (!) 122/56   Pulse 65   Ht 6\' 1"  (1.854 m)   Wt (!) 217 lb (98.4 kg)   SpO2 97%   BMI 28.63 kg/m    General: Well developed, well nourished,male appearing in no acute distress. Head:  Normocephalic, atraumatic, sclera non-icteric.  Neck: No carotid bruits. JVD not elevated.  Lungs: Respirations regular and unlabored, without wheezes or rales.  Heart: Regular rate and rhythm with frequent ectopic beats. No S3 or S4.  No murmur, no rubs, or gallops appreciated. Abdomen: Soft, non-tender, non-distended. No obvious abdominal masses. Msk:  Strength and tone appear normal for age. No obvious joint deformities or effusions. Extremities: No clubbing or cyanosis. No lower extremity edema.  Distal pedal pulses are 2+ bilaterally. Neuro: Alert and oriented X 3. Moves all extremities spontaneously. No focal deficits noted. Psych:  Responds to questions appropriately with a normal affect. Skin: No rashes or lesions noted  Wt Readings from Last 3 Encounters:  06/06/20 (!) 217 lb (98.4 kg)  06/02/20 (!) 215 lb (97.5 kg)  05/02/20 219 lb (99.3 kg)     Studies/Labs Reviewed:   EKG:  EKG is ordered today.  The ekg ordered today demonstrates NSR, HR 87 with PAC's. No acute ST abnormalities when compared to prior tracings.   Recent Labs: 06/02/2020: ALT 35; Hemoglobin 16.8; Platelets 212 06/06/2020: BUN 20; Creatinine, Ser 1.32; Potassium 3.2; Sodium 139   Lipid Panel    Component Value Date/Time   CHOL 143 02/07/2020 0948   TRIG 31 02/07/2020 0948   HDL 56 02/07/2020 0948   CHOLHDL 2.6 02/07/2020 0948   CHOLHDL 2.3 01/07/2015 0920   VLDL 6 01/07/2015 0920   LDLCALC 79 02/07/2020 0948    Additional studies/ records that were reviewed today include:   Echocardiogram: 02/2018 Study Conclusions   - Left ventricle: The cavity size was normal. Wall thickness was  normal. Systolic function was normal. The estimated ejection  fraction was in the range of 55% to 60%. Wall motion was normal;  there were no regional wall motion abnormalities. The study is  not technically sufficient to allow evaluation of LV diastolic  function due to atrial flutter.  - Mitral valve:  There was mild regurgitation.   Assessment:    1. Atrial flutter, paroxysmal (HCC)   2. Hypokalemia   3. Essential hypertension   4. Mixed hyperlipidemia      Plan:   In order of problems listed above:  1. Paroxysmal Atrial Flutter - He has a history of remote flutter by review of notes and was found to have recurrent atrial flutter during his recent ED evaluation in the setting of diarrhea, dehydration, and electrolyte abnormalities. He has since converted back to NSR. Would not start a BB at this time as HR was variable from the 40's to 60's during today's visit.  - He denies any evidence of active bleeding and remains on Xarelto 20mg  daily for anticoagulation. Hgb was stable at 16.8 by recent labs with platelets at 212. - He was overall asymptomatic with his episode but does report weakness and is unsure if this is due to his recent GI illness or arrhythmia. His diarrhea has now resolved and I encouraged him to follow symptoms over the next few weeks and if weakness persists, would plan for a 2-week cardiac event monitor to assess for recurrent arrhythmias. Briefly reviewed an ablation but he is not interested in invasive procedures at this time which certainly seems reasonable given his infrequent episodes thus far.   2. Hypokalemia - K+ was at 3.1 during his recent Emergency Department evaluation. Suspect this was secondary to his diarrhea and the use of HCTZ resulting in dehydration. Will recheck a BMET today.   3. HTN - BP is well-controlled at 122/56 during today's visit. Continue current medication regimen with Amlodipine-Benazepril 10-20mg  daily and HCTZ 25mg  daily.   4. HLD - FLP in 01/2020 showed total cholesterol of 143, HDL 56 and LDL 79. He remains on Pravastatin 80mg  daily.   Medication Adjustments/Labs and Tests Ordered: Current medicines are reviewed at length with the patient today.  Concerns regarding medicines are outlined above.  Medication changes, Labs and Tests  ordered today are listed in the Patient Instructions below. Patient Instructions  Medication Instructions:  Your physician recommends that you continue on your current medications as directed. Please refer to the Current Medication list given to you today.  *If you need a refill  on your cardiac medications before your next appointment, please call your pharmacy*   Lab Work: Your physician recommends that you return for lab work in: BMET   If you have labs (blood work) drawn today and your tests are completely normal, you will receive your results only by: Marland Kitchen MyChart Message (if you have MyChart) OR . A paper copy in the mail If you have any lab test that is abnormal or we need to change your treatment, we will call you to review the results.   Testing/Procedures: NONE    Follow-Up: At [facility], you and your health needs are our priority.  As part of our continuing mission to provide you with exceptional heart care, we have created designated Provider Care Teams.  These Care Teams include your primary Cardiologist (physician) and Advanced Practice Providers (APPs -  Physician Assistants and Nurse Practitioners) who all work together to provide you with the care you need, when you need it.  We recommend signing up for the patient portal called "MyChart".  Sign up information is provided on this After Visit Summary.  MyChart is used to connect with patients for Virtual Visits (Telemedicine).  Patients are able to view lab/test results, encounter notes, upcoming appointments, etc.  Non-urgent messages can be sent to your provider as well.   To learn more about what you can do with MyChart, go to [email or web address].    Your next appointment:   6 month(s)  The format for your next appointment:   In Person  Provider:   Dina Rich, MD   Other Instructions Thank you for choosing [facility]!       Signed, Ellsworth Lennox, PA-C  06/06/2020 8:17 PM      [facility] [address] [place], [place] [address] Phone: [phone] Fax: [phone]

## 2020-06-05 NOTE — Telephone Encounter (Signed)
Per AVS from ED-patient to contact PCP today Message sent to [facility] to schedule appointment in our clinic

## 2020-06-05 NOTE — Telephone Encounter (Signed)
Patient was seen in the ER for diarrhea/Atrial Fibrillation. Was told to fu with cardiology today. Patient states that he feels weak. States that the ER was concerned about his heart rate.

## 2020-06-06 ENCOUNTER — Other Ambulatory Visit (HOSPITAL_COMMUNITY)
Admission: RE | Admit: 2020-06-06 | Discharge: 2020-06-06 | Disposition: A | Discharge status: Home / Self Care | Payer: Managed Care, Other (non HMO) | Source: Ambulatory Visit | Attending: Student | Admitting: Student

## 2020-06-06 ENCOUNTER — Encounter: Payer: Self-pay | Admitting: Student

## 2020-06-06 ENCOUNTER — Other Ambulatory Visit: Payer: Self-pay

## 2020-06-06 ENCOUNTER — Ambulatory Visit: Payer: Managed Care, Other (non HMO) | Admitting: Student

## 2020-06-06 VITALS — BP 122/56 | HR 65 | Ht 73.0 in | Wt 217.0 lb

## 2020-06-06 DIAGNOSIS — I4892 Unspecified atrial flutter: Secondary | ICD-10-CM | POA: Diagnosis not present

## 2020-06-06 DIAGNOSIS — E876 Hypokalemia: Secondary | ICD-10-CM | POA: Insufficient documentation

## 2020-06-06 DIAGNOSIS — E782 Mixed hyperlipidemia: Secondary | ICD-10-CM | POA: Diagnosis not present

## 2020-06-06 DIAGNOSIS — I10 Essential (primary) hypertension: Secondary | ICD-10-CM

## 2020-06-06 LAB — BASIC METABOLIC PANEL
Anion gap: 9 (ref 5–15)
BUN: 20 mg/dL (ref 6–20)
CO2: 26 mmol/L (ref 22–32)
Calcium: 8.9 mg/dL (ref 8.9–10.3)
Chloride: 104 mmol/L (ref 98–111)
Creatinine, Ser: 1.32 mg/dL — ABNORMAL HIGH (ref 0.61–1.24)
GFR calc Af Amer: >60 mL/min (ref >60)
GFR calc non Af Amer: 59 mL/min — ABNORMAL LOW (ref >60)
Glucose, Bld: 89 mg/dL (ref 70–99)
Potassium: 3.2 mmol/L — ABNORMAL LOW (ref 3.5–5.1)
Sodium: 139 mmol/L (ref 135–145)

## 2020-06-06 NOTE — Patient Instructions (Signed)
Medication Instructions:  Your physician recommends that you continue on your current medications as directed. Please refer to the Current Medication list given to you today.  *If you need a refill on your cardiac medications before your next appointment, please call your pharmacy*   Lab Work: Your physician recommends that you return for lab work in: BMET   If you have labs (blood work) drawn today and your tests are completely normal, you will receive your results only by:  MyChart Message (if you have MyChart) OR  A paper copy in the mail If you have any lab test that is abnormal or we need to change your treatment, we will call you to review the results.   Testing/Procedures: NONE    Follow-Up: At [facility], you and your health needs are our priority.  As part of our continuing mission to provide you with exceptional heart care, we have created designated Provider Care Teams.  These Care Teams include your primary Cardiologist (physician) and Advanced Practice Providers (APPs -  Physician Assistants and Nurse Practitioners) who all work together to provide you with the care you need, when you need it.  We recommend signing up for the patient portal called "MyChart".  Sign up information is provided on this After Visit Summary.  MyChart is used to connect with patients for Virtual Visits (Telemedicine).  Patients are able to view lab/test results, encounter notes, upcoming appointments, etc.  Non-urgent messages can be sent to your provider as well.   To learn more about what you can do with MyChart, go to [email or web address].    Your next appointment:   6 month(s)  The format for your next appointment:   In Person  Provider:   Dina Rich, MD   Other Instructions Thank you for choosing [facility]!

## 2020-06-07 ENCOUNTER — Telehealth: Payer: Self-pay | Admitting: *Deleted

## 2020-06-07 ENCOUNTER — Telehealth: Payer: Self-pay | Admitting: Student

## 2020-06-07 DIAGNOSIS — Z79899 Other long term (current) drug therapy: Secondary | ICD-10-CM

## 2020-06-07 MED ORDER — POTASSIUM CHLORIDE CRYS ER 20 MEQ PO TBCR: 20.0000 meq | EXTENDED_RELEASE_TABLET | Freq: Every day | ORAL | 3 refills | Status: DC

## 2020-06-07 NOTE — Telephone Encounter (Signed)
Called and left message for patient to call office. Need to set up an appointment for an ER visit.

## 2020-06-07 NOTE — Telephone Encounter (Signed)
Pt.notified

## 2020-06-07 NOTE — Telephone Encounter (Signed)
-----   Message from Ellsworth Lennox, [place] sent at 06/06/2020  4:27 PM EDT ----- Please let the patient know that his renal function remains stable but potassium remains low at 3.2. I would recommend starting K-dur 20 mEq daily. Repeat BMET in 2 weeks.

## 2020-06-20 ENCOUNTER — Other Ambulatory Visit (HOSPITAL_COMMUNITY)
Admission: RE | Admit: 2020-06-20 | Discharge: 2020-06-20 | Disposition: A | Discharge status: Home / Self Care | Payer: Managed Care, Other (non HMO) | Source: Ambulatory Visit | Attending: Student | Admitting: Student

## 2020-06-20 DIAGNOSIS — Z79899 Other long term (current) drug therapy: Secondary | ICD-10-CM | POA: Insufficient documentation

## 2020-06-20 LAB — BASIC METABOLIC PANEL
Anion gap: 6 (ref 5–15)
BUN: 21 mg/dL — ABNORMAL HIGH (ref 6–20)
CO2: 24 mmol/L (ref 22–32)
Calcium: 8.9 mg/dL (ref 8.9–10.3)
Chloride: 109 mmol/L (ref 98–111)
Creatinine, Ser: 1.19 mg/dL (ref 0.61–1.24)
GFR calc Af Amer: >60 mL/min (ref >60)
GFR calc non Af Amer: >60 mL/min (ref >60)
Glucose, Bld: 92 mg/dL (ref 70–99)
Potassium: 3.4 mmol/L — ABNORMAL LOW (ref 3.5–5.1)
Sodium: 139 mmol/L (ref 135–145)

## 2020-06-27 ENCOUNTER — Telehealth: Payer: Self-pay | Admitting: *Deleted

## 2020-06-27 DIAGNOSIS — I10 Essential (primary) hypertension: Secondary | ICD-10-CM

## 2020-06-27 MED ORDER — POTASSIUM CHLORIDE CRYS ER 20 MEQ PO TBCR
40.0000 meq | EXTENDED_RELEASE_TABLET | Freq: Every day | ORAL | 1 refills | Status: DC
Start: 2020-06-27 — End: 2020-12-25

## 2020-06-27 NOTE — Telephone Encounter (Signed)
Pt voiced understanding - updated medication list - lab orders placed  

## 2020-06-27 NOTE — Telephone Encounter (Signed)
-----   Message from Antoine Poche, MD sent at 06/26/2020  8:09 PM EDT ----- Potassium remains low, increase KCl to daily. Repeat BMET and Mg in 3 weeks  Dominga Ferry MD

## 2020-06-29 ENCOUNTER — Other Ambulatory Visit: Payer: Self-pay | Admitting: *Deleted

## 2020-06-29 MED ORDER — AMLODIPINE BESY-BENAZEPRIL HCL 10-20 MG PO CAPS: ORAL_CAPSULE | ORAL | 0 refills | Status: DC

## 2020-06-29 MED ORDER — PRAVASTATIN SODIUM 80 MG PO TABS: 80.0000 mg | ORAL_TABLET | Freq: Every evening | ORAL | 0 refills | Status: DC

## 2020-06-29 MED ORDER — HYDROCHLOROTHIAZIDE 25 MG PO TABS: 25.0000 mg | ORAL_TABLET | Freq: Every day | ORAL | 0 refills | Status: DC

## 2020-08-07 ENCOUNTER — Ambulatory Visit: Payer: Managed Care, Other (non HMO) | Admitting: Family Medicine

## 2020-08-07 ENCOUNTER — Other Ambulatory Visit: Payer: Self-pay | Admitting: Cardiology

## 2020-08-07 ENCOUNTER — Other Ambulatory Visit: Payer: Self-pay

## 2020-08-07 ENCOUNTER — Encounter: Payer: Self-pay | Admitting: Family Medicine

## 2020-08-07 VITALS — BP 134/82 | HR 43 | Temp 97.5°F | Ht 71.0 in | Wt 220.2 lb

## 2020-08-07 DIAGNOSIS — R001 Bradycardia, unspecified: Secondary | ICD-10-CM

## 2020-08-07 DIAGNOSIS — Z125 Encounter for screening for malignant neoplasm of prostate: Secondary | ICD-10-CM

## 2020-08-07 DIAGNOSIS — I4891 Unspecified atrial fibrillation: Secondary | ICD-10-CM | POA: Diagnosis not present

## 2020-08-07 DIAGNOSIS — E119 Type 2 diabetes mellitus without complications: Secondary | ICD-10-CM | POA: Diagnosis not present

## 2020-08-07 DIAGNOSIS — I10 Essential (primary) hypertension: Secondary | ICD-10-CM | POA: Diagnosis not present

## 2020-08-07 MED ORDER — SILDENAFIL CITRATE 20 MG PO TABS: ORAL_TABLET | ORAL | 4 refills | Status: DC

## 2020-08-07 MED ORDER — RIVAROXABAN 20 MG PO TABS
ORAL_TABLET | ORAL | 1 refills | Status: DC
Start: 2020-08-07 — End: 2021-02-05

## 2020-08-07 MED ORDER — HYDROCHLOROTHIAZIDE 25 MG PO TABS
25.0000 mg | ORAL_TABLET | Freq: Every day | ORAL | 1 refills | Status: DC
Start: 2020-08-07 — End: 2021-02-05

## 2020-08-07 MED ORDER — PRAVASTATIN SODIUM 80 MG PO TABS
80.0000 mg | ORAL_TABLET | Freq: Every evening | ORAL | 1 refills | Status: DC
Start: 2020-08-07 — End: 2021-02-05

## 2020-08-07 MED ORDER — AMLODIPINE BESY-BENAZEPRIL HCL 10-20 MG PO CAPS
ORAL_CAPSULE | ORAL | 1 refills | Status: DC
Start: 2020-08-07 — End: 2021-02-05

## 2020-08-07 NOTE — Progress Notes (Signed)
Patient ID: Brian Ewing, male    DOB: 10-22-1961, 59 y.o.   MRN: [id number]   Chief Complaint  Patient presents with   Diabetes   Hypertension   Subjective:    HPI Pt here to establish care. H/o DM, htn, A fib, and HLD. Sugars are doing well; checking sugars about twice a month but did check it some this week. Potassium is not where it needs to be per patient. Pt concern today is annual physical. Last one 08/10/19. Pt does need bloodwork to see where number are.  Last visit for labs, from cardiologist. Noticing low potassium, last visit was 3.4.   Had diarrhea in early august, which they think caused the low potassium. Went to urgent care.  Then to the ER, was given large dose of potassium. They increased potassium to daily. Then supposed to go today to recheck.  H/o Afib and on xarelto. Seeing Dr. Wyline Mood, cards and h/o atrial flutter.  Started xarelo 2019.  DM2- diet modification. No meds. Doing well.  Lost abround 90 lbs and not needing meds.  Medical History Brian Ewing has a past medical history of Atrial flutter (HCC), Diabetes mellitus without complication (HCC), Elevated hemoglobin A1c, Hyperlipidemia, and Hypertension.   Outpatient Encounter Medications as of 08/07/2020  Medication Sig   acetaminophen (TYLENOL) 500 MG tablet Take 500-1,000 mg by mouth every 6 (six) hours as needed for mild pain or moderate pain.   amLODipine-benazepril (LOTREL) 10-20 MG capsule TAKE ONE CAPSULE BY MOUTH DAILY   hydrochlorothiazide (HYDRODIURIL) 25 MG tablet Take 1 tablet (25 mg total) by mouth daily.   potassium chloride SA (KLOR-CON) 20 MEQ tablet Take 2 tablets (40 mEq total) by mouth daily.   pravastatin (PRAVACHOL) 80 MG tablet Take 1 tablet (80 mg total) by mouth every evening.   rivaroxaban (XARELTO) 20 MG TABS tablet TAKE ONE TABLET BY MOUTH DAILY WITH SUPPER   sildenafil (REVATIO) 20 MG tablet TAKE TWO AND ONE-HALF (50 MG TOTAL) TABLETS BY MOUTH AS NEEDED DAILY     [DISCONTINUED] amLODipine-benazepril (LOTREL) 10-20 MG capsule TAKE ONE CAPSULE BY MOUTH DAILY   [DISCONTINUED] hydrochlorothiazide (HYDRODIURIL) 25 MG tablet Take 1 tablet (25 mg total) by mouth daily.   [DISCONTINUED] pravastatin (PRAVACHOL) 80 MG tablet Take 1 tablet (80 mg total) by mouth every evening.   [DISCONTINUED] sildenafil (REVATIO) 20 MG tablet TAKE TWO AND ONE-HALF (50 MG TOTAL) TABLETS BY MOUTH AS NEEDED DAILY (Patient taking differently: Take 50 mg by mouth daily as needed. )   [DISCONTINUED] XARELTO 20 MG TABS tablet TAKE ONE TABLET BY MOUTH DAILY WITH SUPPER (Patient taking differently: Take 20 mg by mouth in the morning. )   [DISCONTINUED] ondansetron (ZOFRAN) 4 MG tablet Take 1 tablet (4 mg total) by mouth every 8 (eight) hours as needed for nausea or vomiting.   No facility-administered encounter medications on file as of 08/07/2020.     Review of Systems  Constitutional: Negative for chills and fever.  HENT: Negative for congestion, rhinorrhea and sore throat.   Respiratory: Negative for cough, shortness of breath and wheezing.   Cardiovascular: Negative for chest pain and leg swelling.  Gastrointestinal: Negative for abdominal pain, diarrhea, nausea and vomiting.  Genitourinary: Negative for dysuria and frequency.  Skin: Negative for rash.  Neurological: Negative for dizziness, weakness and headaches.      Vitals BP 134/82    Pulse (!) 43    Temp (!) 97.5 F (36.4 C)    Ht 5\' 11"  (  1.803 m)    Wt 220 lb 3.2 oz (99.9 kg)    SpO2 99%    BMI 30.71 kg/m   Objective:   Physical Exam Vitals and nursing note reviewed.  Constitutional:      General: He is not in acute distress.    Appearance: Normal appearance. He is not ill-appearing.  HENT:     Head: Normocephalic.     Nose: Nose normal. No congestion.     Mouth/Throat:     Mouth: Mucous membranes are moist.     Pharynx: No oropharyngeal exudate.  Eyes:     Extraocular Movements: Extraocular movements  intact.     Conjunctiva/sclera: Conjunctivae normal.     Pupils: Pupils are equal, round, and reactive to light.  Cardiovascular:     Rate and Rhythm: Bradycardia present. Rhythm irregular.     Pulses: Normal pulses.     Heart sounds: Normal heart sounds. No murmur heard.   Pulmonary:     Effort: Pulmonary effort is normal. No respiratory distress.     Breath sounds: Normal breath sounds. No wheezing, rhonchi or rales.  Musculoskeletal:        General: Normal range of motion.     Right lower leg: No edema.     Left lower leg: No edema.  Skin:    General: Skin is warm and dry.     Findings: No rash.  Neurological:     General: No focal deficit present.     Mental Status: He is alert and oriented to person, place, and time.     Cranial Nerves: No cranial nerve deficit.  Psychiatric:        Mood and Affect: Mood normal.        Behavior: Behavior normal.        Thought Content: Thought content normal.        Judgment: Judgment normal.      Assessment and Plan   1. Atrial fibrillation, unspecified type (HCC) - CBC  2. Essential hypertension - Lipid panel  3. Diabetes mellitus without complication (HCC) - Hemoglobin A1c - Lipid panel  4. Bradycardia  5. Screening PSA (prostate specific antigen) - PSA; Future   H/o afib- stable.  Cont meds.  Bradycardic today at 43bpm.  Asymptomatic.  All other HR in past 1-2 yrs in 60-70s. Advising for pt to call Dr. Wyline Mood and let him know on next visit about his low heart rate. Pt to get bmp and Mag for Dr. Wyline Mood.    DM2-  Stable, diet modification.  Not on meds. We ordered a1c, lipids and cbc. Fasting today. cont diet modification and pt has already lost 80-100lbs.   htn- stable. Cont meds.  Hypokalemia- stable. Cont meds.  Recheck labs today.  F/u 33mo orprn.

## 2020-08-08 LAB — HEMOGLOBIN A1C
Est. average glucose Bld gHb Est-mCnc: 123 mg/dL
Hgb A1c MFr Bld: 5.9 % — ABNORMAL HIGH (ref 4.8–5.6)

## 2020-08-08 LAB — BASIC METABOLIC PANEL
BUN/Creatinine Ratio: 15 (ref 10–24)
BUN: 19 mg/dL (ref 8–27)
CO2: 24 mmol/L (ref 20–29)
Calcium: 9.4 mg/dL (ref 8.6–10.2)
Chloride: 102 mmol/L (ref 96–106)
Creatinine, Ser: 1.27 mg/dL (ref 0.76–1.27)
GFR calc Af Amer: 71 mL/min/{1.73_m2} (ref >59)
GFR calc non Af Amer: 61 mL/min/{1.73_m2} (ref >59)
Glucose: 86 mg/dL (ref 65–99)
Potassium: 4.1 mmol/L (ref 3.5–5.2)
Sodium: 140 mmol/L (ref 134–144)

## 2020-08-08 LAB — MAGNESIUM: Magnesium: 2.2 mg/dL (ref 1.6–2.3)

## 2020-08-08 LAB — LIPID PANEL
Chol/HDL Ratio: 2.7 ratio (ref 0.0–5.0)
Cholesterol, Total: 145 mg/dL (ref 100–199)
HDL: 53 mg/dL (ref >39)
LDL Chol Calc (NIH): 84 mg/dL (ref 0–99)
Triglycerides: 32 mg/dL (ref 0–149)
VLDL Cholesterol Cal: 8 mg/dL (ref 5–40)

## 2020-08-08 LAB — CBC
Hematocrit: 42.5 % (ref 37.5–51.0)
Hemoglobin: 15.1 g/dL (ref 13.0–17.7)
MCH: 31.3 pg (ref 26.6–33.0)
MCHC: 35.5 g/dL (ref 31.5–35.7)
MCV: 88 fL (ref 79–97)
Platelets: 212 10*3/uL (ref 150–450)
RBC: 4.82 x10E6/uL (ref 4.14–5.80)
RDW: 13.1 % (ref 11.6–15.4)
WBC: 3.3 10*3/uL — ABNORMAL LOW (ref 3.4–10.8)

## 2020-08-08 LAB — SPECIMEN STATUS REPORT

## 2020-08-17 ENCOUNTER — Telehealth: Payer: Self-pay | Admitting: *Deleted

## 2020-08-17 NOTE — Telephone Encounter (Signed)
-----   Message from Antoine Poche, MD sent at 08/16/2020  3:54 PM EDT ----- Normal labs  Dominga Ferry MD

## 2020-08-17 NOTE — Telephone Encounter (Signed)
Pt aware.

## 2020-09-26 ENCOUNTER — Other Ambulatory Visit: Payer: Self-pay | Admitting: Family Medicine

## 2020-11-17 ENCOUNTER — Ambulatory Visit: Payer: Managed Care, Other (non HMO) | Admitting: Cardiology

## 2020-11-28 ENCOUNTER — Telehealth: Payer: Self-pay | Admitting: Student

## 2020-11-28 NOTE — Progress Notes (Addendum)
Virtual Visit via Telephone Note   This visit type was conducted due to national recommendations for restrictions regarding the COVID-19 Pandemic (e.g. social distancing) in an effort to limit this patient's exposure and mitigate transmission in our community.  Due to his co-morbid illnesses, this patient is at least at moderate risk for complications without adequate follow up.  This format is felt to be most appropriate for this patient at this time.  The patient did not have access to video technology/had technical difficulties with video requiring transitioning to audio format only (telephone).  All issues noted in this document were discussed and addressed.  No physical exam could be performed with this format.  Please refer to the patient's chart for his  consent to telehealth for [facility].    Date:  11/29/2020   ID:  Brian Ewing, Brian Ewing 06/24/1961, MRN [id number] The patient was identified using 2 identifiers.  Patient Location: Other:  Work Provider Location: Office/Clinic  PCP:  Annalee Genta, DO  Cardiologist:  Dina Rich, MD Electrophysiologist:  None   Evaluation Performed:  Follow-Up Visit  Chief Complaint:  6 month visit  History of Present Illness:    Brian Ewing is a 60 y.o. male with past medical history of paroxysmal atrial flutter, HTN, prediabetes and HLD who presents for a 16-month follow-up telehealth visit.  He was last examined by myself in 05/2020 for follow-up from a recent ED visit during which he was found to be in paroxysmal atrial flutter. At the time of his visit, he denied any recent chest pain or palpitations but did report weakness which he thought was secondary to having diarrhea for several days. He was in normal sinus rhythm and was continued on Xarelto for anticoagulation. He was not started on a beta-blocker due to his baseline heart rate in the 40's to 60's. He was encouraged to follow his symptoms and if he had any palpitations,  would plan for a 2-week monitor to assess for recurrent arrhythmias.  In talking with the patient today, he reports he switched to a virtual visit due to a COVID-19 exposure but was informed yesterday that his test was negative. He reports overall doing well since his last office visit. He denies any recent palpitations or exertional chest pain. No recent dyspnea on exertion, orthopnea, PND or lower extremity edema.  He reports good compliance with Xarelto and denies any recent melena, hematochezia or hematuria.  The patient does not have symptoms concerning for COVID-19 infection (fever, chills, cough, or new shortness of breath).    Past Medical History:  Diagnosis Date  . Atrial flutter (HCC)   . Diabetes mellitus without complication (HCC)   . Elevated hemoglobin A1c   . Hyperlipidemia   . Hypertension    Past Surgical History:  Procedure Laterality Date  . ARTHROSCOPY KNEE W/ DRILLING       Current Meds  Medication Sig  . acetaminophen (TYLENOL) 500 MG tablet Take 500-1,000 mg by mouth every 6 (six) hours as needed for mild pain or moderate pain.  Marland Kitchen amLODipine-benazepril (LOTREL) 10-20 MG capsule TAKE ONE CAPSULE BY MOUTH DAILY  . hydrochlorothiazide (HYDRODIURIL) 25 MG tablet Take 1 tablet (25 mg total) by mouth daily.  . potassium chloride SA (KLOR-CON) 20 MEQ tablet Take 2 tablets (40 mEq total) by mouth daily.  . pravastatin (PRAVACHOL) 80 MG tablet Take 1 tablet (80 mg total) by mouth every evening.  . rivaroxaban (XARELTO) 20 MG TABS tablet TAKE ONE TABLET BY  MOUTH DAILY WITH SUPPER  . sildenafil (REVATIO) 20 MG tablet TAKE TWO AND ONE-HALF (50 MG TOTAL) TABLETS BY MOUTH AS NEEDED DAILY     Allergies:   Patient has no known allergies.   Social History   Tobacco Use  . Smoking status: Never Smoker  . Smokeless tobacco: Never Used  Substance Use Topics  . Alcohol use: No    Alcohol/week: 0.0 standard drinks  . Drug use: No     Family Hx: The patient's family  history includes Heart disease in his father; Stomach cancer in his father.  ROS:   Please see the history of present illness.     All other systems reviewed and are negative.   Prior CV studies:   The following studies were reviewed today:  Echocardiogram: 02/2018 Study Conclusions   - Left ventricle: The cavity size was normal. Wall thickness was  normal. Systolic function was normal. The estimated ejection  fraction was in the range of 55% to 60%. Wall motion was normal;  there were no regional wall motion abnormalities. The study is  not technically sufficient to allow evaluation of LV diastolic  function due to atrial flutter.  - Mitral valve: There was mild regurgitation.   Labs/Other Tests and Data Reviewed:    EKG:  No ECG reviewed.  Recent Labs: 06/02/2020: ALT 35 08/07/2020: BUN 19; Creatinine, Ser 1.27; Hemoglobin 15.1; Magnesium 2.2; Platelets 212; Potassium 4.1; Sodium 140   Recent Lipid Panel Lab Results  Component Value Date/Time   CHOL 145 08/07/2020 10:46 AM   TRIG 32 08/07/2020 10:46 AM   HDL 53 08/07/2020 10:46 AM   CHOLHDL 2.7 08/07/2020 10:46 AM   CHOLHDL 2.3 01/07/2015 09:20 AM   LDLCALC 84 08/07/2020 10:46 AM    Wt Readings from Last 3 Encounters:  11/29/20 220 lb (99.8 kg)  08/07/20 220 lb 3.2 oz (99.9 kg)  06/06/20 (!) 217 lb (98.4 kg)     Objective:    Vital Signs:  BP 124/68   Ht 6\' 1"  (1.854 m)   Wt 220 lb (99.8 kg)   BMI 29.03 kg/m    General: Pleasant male sounding in NAD Psych: Normal affect. Neuro: Alert and oriented X 3. Lungs:  Resp regular and unlabored while talking on the phone.    ASSESSMENT & PLAN:    1. Paroxysmal Atrial Flutter - He denies any recent palpitations and he has not been on AV nodal blocking agents given baseline bradycardia. - He remains on Xarelto 20 mg daily for anticoagulation given his CHA2DS2-VASc Score of 2 (HTN, Type 2 DM). CBC in 07/2020 showed stable Hgb of 15.1 and platelets  212.  2. HTN - BP was well controlled at 124/68 when checked earlier today. Continue current regimen with Amlodipine-Benazepril and HCTZ. He did previously have issues with hypokalemia likely due to the use of HCTZ and remains on K+ supplementation. Will order a BMET for reassessment.   3. HLD - FLP in 07/2020 showed total cholesterol 145, triglycerides 32, HDL 53 and LDL 84. - Continue Pravastatin 80mg  daily.     COVID-19 Education: The signs and symptoms of COVID-19 were discussed with the patient and how to seek care for testing (follow up with PCP or arrange E-visit).  The importance of social distancing was discussed today.  Time:   Today, I have spent 13 minutes with the patient with telehealth technology discussing the above problems.     Medication Adjustments/Labs and Tests Ordered: Current medicines are reviewed at length  with the patient today.  Concerns regarding medicines are outlined above.   Tests Ordered: Orders Placed This Encounter  Procedures  . Basic Metabolic Panel (BMET)    Medication Changes: No orders of the defined types were placed in this encounter.   Follow Up:  In Person in 1 year(s)  Signed, Ellsworth Lennox, PA-C  11/29/2020 4:27 PM    [facility]

## 2020-11-28 NOTE — Telephone Encounter (Signed)
  Patient Consent for Virtual Visit         Brian Ewing has provided verbal consent on 11/28/2020 for a virtual visit (video or telephone).   CONSENT FOR VIRTUAL VISIT FOR:  Brian Ewing  By participating in this virtual visit I agree to the following:  I hereby voluntarily request, consent and authorize [facility] and its employed or contracted physicians, physician assistants, nurse practitioners or other licensed health care professionals (the Practitioner), to provide me with telemedicine health care services (the "Services") as deemed necessary by the treating Practitioner. I acknowledge and consent to receive the Services by the Practitioner via telemedicine. I understand that the telemedicine visit will involve communicating with the Practitioner through live audiovisual communication technology and the disclosure of certain medical information by electronic transmission. I acknowledge that I have been given the opportunity to request an in-person assessment or other available alternative prior to the telemedicine visit and am voluntarily participating in the telemedicine visit.  I understand that I have the right to withhold or withdraw my consent to the use of telemedicine in the course of my care at any time, without affecting my right to future care or treatment, and that the Practitioner or I may terminate the telemedicine visit at any time. I understand that I have the right to inspect all information obtained and/or recorded in the course of the telemedicine visit and may receive copies of available information for a reasonable fee.  I understand that some of the potential risks of receiving the Services via telemedicine include:  Marland Kitchen Delay or interruption in medical evaluation due to technological equipment failure or disruption; . Information transmitted may not be sufficient (e.g. poor resolution of images) to allow for appropriate medical decision making by the Practitioner;  and/or  . In rare instances, security protocols could fail, causing a breach of personal health information.  Furthermore, I acknowledge that it is my responsibility to provide information about my medical history, conditions and care that is complete and accurate to the best of my ability. I acknowledge that Practitioner's advice, recommendations, and/or decision may be based on factors not within their control, such as incomplete or inaccurate data provided by me or distortions of diagnostic images or specimens that may result from electronic transmissions. I understand that the practice of medicine is not an exact science and that Practitioner makes no warranties or guarantees regarding treatment outcomes. I acknowledge that a copy of this consent can be made available to me via my patient portal [facility] MyChart), or I can request a printed copy by calling the office of [facility].    I understand that my insurance will be billed for this visit.   I have read or had this consent read to me. . I understand the contents of this consent, which adequately explains the benefits and risks of the Services being provided via telemedicine.  . I have been provided ample opportunity to ask questions regarding this consent and the Services and have had my questions answered to my satisfaction. . I give my informed consent for the services to be provided through the use of telemedicine in my medical care

## 2020-11-29 ENCOUNTER — Telehealth (INDEPENDENT_AMBULATORY_CARE_PROVIDER_SITE_OTHER): Payer: Managed Care, Other (non HMO) | Admitting: Student

## 2020-11-29 ENCOUNTER — Other Ambulatory Visit: Payer: Self-pay

## 2020-11-29 ENCOUNTER — Encounter: Payer: Self-pay | Admitting: Student

## 2020-11-29 VITALS — BP 124/68 | Ht 73.0 in | Wt 220.0 lb

## 2020-11-29 DIAGNOSIS — Z79899 Other long term (current) drug therapy: Secondary | ICD-10-CM | POA: Diagnosis not present

## 2020-11-29 DIAGNOSIS — E782 Mixed hyperlipidemia: Secondary | ICD-10-CM | POA: Diagnosis not present

## 2020-11-29 DIAGNOSIS — I4892 Unspecified atrial flutter: Secondary | ICD-10-CM

## 2020-11-29 DIAGNOSIS — I10 Essential (primary) hypertension: Secondary | ICD-10-CM

## 2020-11-29 NOTE — Patient Instructions (Signed)
Medication Instructions:  Your physician recommends that you continue on your current medications as directed. Please refer to the Current Medication list given to you today.  *If you need a refill on your cardiac medications before your next appointment, please call your pharmacy*   Lab Work: BMET   If you have labs (blood work) drawn today and your tests are completely normal, you will receive your results only by: Marland Kitchen MyChart Message (if you have MyChart) OR . A paper copy in the mail If you have any lab test that is abnormal or we need to change your treatment, we will call you to review the results.   Testing/Procedures: None Today   Follow-Up: At [facility], you and your health needs are our priority.  As part of our continuing mission to provide you with exceptional heart care, we have created designated Provider Care Teams.  These Care Teams include your primary Cardiologist (physician) and Advanced Practice Providers (APPs -  Physician Assistants and Nurse Practitioners) who all work together to provide you with the care you need, when you need it.  We recommend signing up for the patient portal called "MyChart".  Sign up information is provided on this After Visit Summary.  MyChart is used to connect with patients for Virtual Visits (Telemedicine).  Patients are able to view lab/test results, encounter notes, upcoming appointments, etc.  Non-urgent messages can be sent to your provider as well.   To learn more about what you can do with MyChart, go to [email or web address].    Your next appointment:   12 month(s)  The format for your next appointment:   In Person  Provider:   Dina Rich, MD   Other Instructions None Today  '

## 2020-12-23 ENCOUNTER — Other Ambulatory Visit: Payer: Self-pay | Admitting: Cardiology

## 2021-02-05 ENCOUNTER — Other Ambulatory Visit (HOSPITAL_COMMUNITY)
Admission: RE | Admit: 2021-02-05 | Discharge: 2021-02-05 | Disposition: A | Discharge status: Home / Self Care | Payer: Managed Care, Other (non HMO) | Source: Ambulatory Visit | Attending: Student | Admitting: Student

## 2021-02-05 ENCOUNTER — Ambulatory Visit: Payer: Managed Care, Other (non HMO) | Admitting: Family Medicine

## 2021-02-05 ENCOUNTER — Encounter: Payer: Self-pay | Admitting: Family Medicine

## 2021-02-05 ENCOUNTER — Other Ambulatory Visit: Payer: Self-pay

## 2021-02-05 VITALS — BP 118/70 | HR 49 | Temp 97.5°F | Ht 73.0 in | Wt 223.0 lb

## 2021-02-05 DIAGNOSIS — E785 Hyperlipidemia, unspecified: Secondary | ICD-10-CM | POA: Diagnosis not present

## 2021-02-05 DIAGNOSIS — E876 Hypokalemia: Secondary | ICD-10-CM

## 2021-02-05 DIAGNOSIS — I10 Essential (primary) hypertension: Secondary | ICD-10-CM | POA: Diagnosis not present

## 2021-02-05 DIAGNOSIS — I4891 Unspecified atrial fibrillation: Secondary | ICD-10-CM | POA: Diagnosis not present

## 2021-02-05 DIAGNOSIS — E119 Type 2 diabetes mellitus without complications: Secondary | ICD-10-CM | POA: Diagnosis not present

## 2021-02-05 DIAGNOSIS — Z79899 Other long term (current) drug therapy: Secondary | ICD-10-CM | POA: Diagnosis present

## 2021-02-05 LAB — BASIC METABOLIC PANEL
Anion gap: 8 (ref 5–15)
BUN: 24 mg/dL — ABNORMAL HIGH (ref 6–20)
CO2: 27 mmol/L (ref 22–32)
Calcium: 9.3 mg/dL (ref 8.9–10.3)
Chloride: 104 mmol/L (ref 98–111)
Creatinine, Ser: 1.38 mg/dL — ABNORMAL HIGH (ref 0.61–1.24)
GFR, Estimated: 59 mL/min — ABNORMAL LOW (ref >60)
Glucose, Bld: 98 mg/dL (ref 70–99)
Potassium: 3.9 mmol/L (ref 3.5–5.1)
Sodium: 139 mmol/L (ref 135–145)

## 2021-02-05 MED ORDER — RIVAROXABAN 20 MG PO TABS: ORAL_TABLET | ORAL | 1 refills | Status: DC

## 2021-02-05 MED ORDER — AMLODIPINE BESY-BENAZEPRIL HCL 10-20 MG PO CAPS: ORAL_CAPSULE | ORAL | 1 refills | Status: DC

## 2021-02-05 MED ORDER — SILDENAFIL CITRATE 20 MG PO TABS: ORAL_TABLET | ORAL | 4 refills | Status: DC

## 2021-02-05 MED ORDER — PRAVASTATIN SODIUM 80 MG PO TABS: 80.0000 mg | ORAL_TABLET | Freq: Every evening | ORAL | 1 refills | Status: DC

## 2021-02-05 MED ORDER — HYDROCHLOROTHIAZIDE 25 MG PO TABS: 25.0000 mg | ORAL_TABLET | Freq: Every day | ORAL | 1 refills | Status: DC

## 2021-02-05 NOTE — Progress Notes (Signed)
Patient ID: [name], male    DOB: 07/29/1961, 60 y.o.   MRN: [id number]   Chief Complaint  Patient presents with  . Hypertension  . Atrial Fibrillation  . Hyperlipidemia  . Diabetes   Subjective:    HPI  med check up. Pt states no concerns today.  No chest pain, sob, or leg sweeling. Elevated BUN and Cr, slight increase today on labs.  Advising to keep hydrated.  History of A. fib- Taking xarelto and h/o afib. No bleeding concerns or blood in stool. Seeing cardiology.  History of hypokalemia- Pt is on potassium and had some low K. Today K is 3.9.  Patient has a history of diabetes type 2.  On diet modification.  Doing well. no increased thirst or urination. No foot concerns. Needs eye appt.  Medical History Brian Ewing has a past medical history of Atrial flutter (HCC), Diabetes mellitus without complication (HCC), Elevated hemoglobin A1c, Hyperlipidemia, and Hypertension.   Outpatient Encounter Medications as of 02/05/2021  Medication Sig  . acetaminophen (TYLENOL) 500 MG tablet Take 500-1,000 mg by mouth every 6 (six) hours as needed for mild pain or moderate pain.  Marland Kitchen KLOR-CON M20 20 MEQ tablet TAKE TWO TABLETS BY MOUTH DAILY  . [DISCONTINUED] amLODipine-benazepril (LOTREL) 10-20 MG capsule TAKE ONE CAPSULE BY MOUTH DAILY  . [DISCONTINUED] hydrochlorothiazide (HYDRODIURIL) 25 MG tablet Take 1 tablet (25 mg total) by mouth daily.  . [DISCONTINUED] pravastatin (PRAVACHOL) 80 MG tablet Take 1 tablet (80 mg total) by mouth every evening.  . [DISCONTINUED] rivaroxaban (XARELTO) 20 MG TABS tablet TAKE ONE TABLET BY MOUTH DAILY WITH SUPPER  . [DISCONTINUED] sildenafil (REVATIO) 20 MG tablet TAKE TWO AND ONE-HALF (50 MG TOTAL) TABLETS BY MOUTH AS NEEDED DAILY  . amLODipine-benazepril (LOTREL) 10-20 MG capsule TAKE ONE CAPSULE BY MOUTH DAILY  . hydrochlorothiazide (HYDRODIURIL) 25 MG tablet Take 1 tablet (25 mg total) by mouth daily.  . pravastatin (PRAVACHOL) 80 MG tablet  Take 1 tablet (80 mg total) by mouth every evening.  . rivaroxaban (XARELTO) 20 MG TABS tablet TAKE ONE TABLET BY MOUTH DAILY WITH SUPPER  . sildenafil (REVATIO) 20 MG tablet TAKE TWO AND ONE-HALF (50 MG TOTAL) TABLETS BY MOUTH AS NEEDED DAILY   No facility-administered encounter medications on file as of 02/05/2021.     Review of Systems  Constitutional: Negative for chills and fever.  HENT: Negative for congestion, rhinorrhea and sore throat.   Respiratory: Negative for cough, shortness of breath and wheezing.   Cardiovascular: Negative for chest pain and leg swelling.  Gastrointestinal: Negative for abdominal pain, diarrhea, nausea and vomiting.  Genitourinary: Negative for dysuria and frequency.  Skin: Negative for rash.  Neurological: Negative for dizziness, weakness and headaches.     Vitals BP 118/70   Pulse (!) 49   Temp (!) 97.5 F (36.4 C)   Ht 6\' 1"  (1.854 m)   Wt 223 lb (101.2 kg)   SpO2 100%   BMI 29.42 kg/m   Objective:   Physical Exam Vitals and nursing note reviewed.  Constitutional:      General: He is not in acute distress.    Appearance: Normal appearance. He is not ill-appearing.  HENT:     Head: Normocephalic.     Nose: Nose normal. No congestion.     Mouth/Throat:     Mouth: Mucous membranes are moist.     Pharynx: No oropharyngeal exudate.  Eyes:     Extraocular Movements: Extraocular movements intact.  Conjunctiva/sclera: Conjunctivae normal.     Pupils: Pupils are equal, round, and reactive to light.  Cardiovascular:     Rate and Rhythm: Regular rhythm. Bradycardia present.     Pulses: Normal pulses.     Heart sounds: Normal heart sounds. No murmur heard.   Pulmonary:     Effort: Pulmonary effort is normal.     Breath sounds: Normal breath sounds. No wheezing, rhonchi or rales.  Musculoskeletal:        General: Normal range of motion.     Right lower leg: No edema.     Left lower leg: No edema.  Skin:    General: Skin is warm  and dry.     Findings: No rash.  Neurological:     General: No focal deficit present.     Mental Status: He is alert and oriented to person, place, and time.     Cranial Nerves: No cranial nerve deficit.  Psychiatric:        Mood and Affect: Mood normal.        Behavior: Behavior normal.        Thought Content: Thought content normal.        Judgment: Judgment normal.      Assessment and Plan   1. Diabetes mellitus without complication (HCC)  2. Atrial fibrillation, unspecified type (HCC) - rivaroxaban (XARELTO) 20 MG TABS tablet; TAKE ONE TABLET BY MOUTH DAILY WITH SUPPER  Dispense: 90 tablet; Refill: 1  3. Hyperlipidemia LDL goal <100 - pravastatin (PRAVACHOL) 80 MG tablet; Take 1 tablet (80 mg total) by mouth every evening.  Dispense: 90 tablet; Refill: 1  4. Primary hypertension - amLODipine-benazepril (LOTREL) 10-20 MG capsule; TAKE ONE CAPSULE BY MOUTH DAILY  Dispense: 90 capsule; Refill: 1 - hydrochlorothiazide (HYDRODIURIL) 25 MG tablet; Take 1 tablet (25 mg total) by mouth daily.  Dispense: 90 tablet; Refill: 1  5. Hypokalemia    A. fib-stable.  Continue Xarelto.  Continue follow-up with cardiology.  Hyperlipidemia-stable, continue Pravachol.  Hypertension-stable, continue medications.  Diabetes type 2-stable, continue diet modification.  Decrease carbs and increase exercise.  Patient to get eye appointment for eye exam.  Patient is not having any foot concerns today.  Hypokalemia-labs obtained today, stable.  Continue Klor-Con.   Return in about 6 months (around 08/08/2021) for f/u htn/hld.  02/05/2021

## 2021-02-27 ENCOUNTER — Telehealth: Payer: Self-pay

## 2021-02-27 NOTE — Telephone Encounter (Signed)
-----   Message from Antoine Poche, MD sent at 02/27/2021  8:33 AM EDT ----- Labs look fine. Slight signs of dehydration, be sure to drink plenty of water daily, at least 4 bottles  Dominga Ferry MD

## 2021-02-27 NOTE — Telephone Encounter (Signed)
Patient notified and verbalized understanding. Patient had no questions or concerns at this point.

## 2021-05-25 LAB — HM DIABETES EYE EXAM

## 2021-09-18 ENCOUNTER — Other Ambulatory Visit: Payer: Self-pay | Admitting: Family Medicine

## 2021-09-18 DIAGNOSIS — E785 Hyperlipidemia, unspecified: Secondary | ICD-10-CM

## 2021-09-18 DIAGNOSIS — I10 Essential (primary) hypertension: Secondary | ICD-10-CM

## 2021-09-24 ENCOUNTER — Other Ambulatory Visit: Payer: Self-pay | Admitting: Family Medicine

## 2021-09-24 DIAGNOSIS — I4891 Unspecified atrial fibrillation: Secondary | ICD-10-CM

## 2021-09-24 NOTE — Telephone Encounter (Signed)
Sent message 09/28/21

## 2021-09-27 ENCOUNTER — Other Ambulatory Visit: Payer: Self-pay | Admitting: Cardiology

## 2021-11-16 NOTE — Telephone Encounter (Signed)
Sent message on 11/16/21

## 2021-11-22 NOTE — Telephone Encounter (Signed)
No response to my chart messages °

## 2021-12-05 ENCOUNTER — Ambulatory Visit: Payer: Managed Care, Other (non HMO) | Admitting: Family Medicine

## 2021-12-14 ENCOUNTER — Other Ambulatory Visit: Payer: Self-pay

## 2021-12-14 ENCOUNTER — Ambulatory Visit (INDEPENDENT_AMBULATORY_CARE_PROVIDER_SITE_OTHER): Payer: Managed Care, Other (non HMO) | Admitting: Family Medicine

## 2021-12-14 VITALS — BP 126/74 | HR 90 | Temp 98.8°F | Ht 73.0 in | Wt 228.0 lb

## 2021-12-14 DIAGNOSIS — Z7901 Long term (current) use of anticoagulants: Secondary | ICD-10-CM

## 2021-12-14 DIAGNOSIS — I10 Essential (primary) hypertension: Secondary | ICD-10-CM | POA: Diagnosis not present

## 2021-12-14 DIAGNOSIS — E119 Type 2 diabetes mellitus without complications: Secondary | ICD-10-CM

## 2021-12-14 DIAGNOSIS — Z125 Encounter for screening for malignant neoplasm of prostate: Secondary | ICD-10-CM

## 2021-12-14 DIAGNOSIS — E785 Hyperlipidemia, unspecified: Secondary | ICD-10-CM

## 2021-12-14 DIAGNOSIS — I4891 Unspecified atrial fibrillation: Secondary | ICD-10-CM | POA: Diagnosis not present

## 2021-12-14 DIAGNOSIS — E782 Mixed hyperlipidemia: Secondary | ICD-10-CM

## 2021-12-14 MED ORDER — POTASSIUM CHLORIDE CRYS ER 20 MEQ PO TBCR: 40.0000 meq | EXTENDED_RELEASE_TABLET | Freq: Every day | ORAL | 1 refills | Status: DC

## 2021-12-14 MED ORDER — PRAVASTATIN SODIUM 80 MG PO TABS: 80.0000 mg | ORAL_TABLET | Freq: Every day | ORAL | 1 refills | Status: DC

## 2021-12-14 MED ORDER — HYDROCHLOROTHIAZIDE 25 MG PO TABS: 25.0000 mg | ORAL_TABLET | Freq: Every day | ORAL | 1 refills | Status: DC

## 2021-12-14 MED ORDER — AMLODIPINE BESY-BENAZEPRIL HCL 10-20 MG PO CAPS: 1.0000 | ORAL_CAPSULE | Freq: Every day | ORAL | 1 refills | Status: DC

## 2021-12-14 MED ORDER — ACCU-CHEK SOFTCLIX LANCETS MISC: 12 refills | Status: AC

## 2021-12-14 MED ORDER — RIVAROXABAN 20 MG PO TABS: ORAL_TABLET | ORAL | 1 refills | Status: DC

## 2021-12-14 MED ORDER — GLUCOSE BLOOD VI STRP: ORAL_STRIP | 12 refills | Status: DC

## 2021-12-14 MED ORDER — SILDENAFIL CITRATE 20 MG PO TABS: ORAL_TABLET | ORAL | 4 refills | Status: DC

## 2021-12-14 NOTE — Patient Instructions (Signed)
Labs when you can.  Continue your current medications.  Follow up in 6 months.  Take care  Dr. Adriana Simas

## 2021-12-15 NOTE — Progress Notes (Addendum)
Subjective:  Patient ID: Brian Ewing, male    DOB: February 19, 1961  Age: 61 y.o. MRN: [id number]  CC: Chief Complaint  Patient presents with   Diabetes    Refill test strips and lancets   Hyperlipidemia    HPI:  61 year old male with HTN, Atrial Fibrillation, DM-2, HLD, ED presents for follow up.  Patient states that he is feeling well.  He has no complaints at this time.  HTN Well controlled on HCTZ and Lotrel. Needs refills.   Atrial fibrillation/flutter Follows with cardiology.  Stable at this time.  No meds for rate control.  He is on Xarelto.  Hyperlipidemia Well-controlled on pravastatin.  Patient Active Problem List   Diagnosis Date Noted   Atrial fibrillation ([place]) 08/07/2020   Diabetes mellitus without complication ([place]) 00/34/9179   Erectile dysfunction 10/20/2013   Hypertension 01/29/2013   Mixed hyperlipidemia 01/29/2013   Obesity, unspecified 01/29/2013    Social Hx   Social History   Socioeconomic History   Marital status: Married    Spouse name: Not on file   Number of children: Not on file   Years of education: Not on file   Highest education level: Not on file  Occupational History   Not on file  Tobacco Use   Smoking status: Never   Smokeless tobacco: Never  Substance and Sexual Activity   Alcohol use: No    Alcohol/week: 0.0 standard drinks   Drug use: No   Sexual activity: Not on file  Other Topics Concern   Not on file  Social History Narrative   Not on file   Social Determinants of Health   Financial Resource Strain: Not on file  Food Insecurity: Not on file  Transportation Needs: Not on file  Physical Activity: Not on file  Stress: Not on file  Social Connections: Not on file    Review of Systems  Constitutional: Negative.   Respiratory: Negative.    Cardiovascular: Negative.    Objective:  BP 126/74    Pulse 90    Temp 98.8 F (37.1 C)    Ht 6' 1"  (1.854 m)    Wt 228 lb (103.4 kg)    SpO2 99%    BMI 30.08 kg/m    BP/Weight 12/14/2021 02/05/2021 1/50/5697  Systolic BP 948 016 553  Diastolic BP 74 70 68  Wt. (Lbs) 228 223 220  BMI 30.08 29.42 29.03    Physical Exam Vitals and nursing note reviewed.  Constitutional:      General: He is not in acute distress.    Appearance: Normal appearance. He is not ill-appearing.  HENT:     Head: Normocephalic and atraumatic.  Eyes:     General:        Right eye: No discharge.        Left eye: No discharge.     Conjunctiva/sclera: Conjunctivae normal.  Cardiovascular:     Comments: Irregularly irregular. Pulmonary:     Effort: Pulmonary effort is normal.     Breath sounds: Normal breath sounds. No wheezing, rhonchi or rales.  Neurological:     Mental Status: He is alert.  Psychiatric:        Mood and Affect: Mood normal.        Behavior: Behavior normal.    Lab Results  Component Value Date   WBC 3.3 (L) 08/07/2020   HGB 15.1 08/07/2020   HCT 42.5 08/07/2020   PLT 212 08/07/2020   GLUCOSE 98 02/05/2021  CHOL 145 08/07/2020   TRIG 32 08/07/2020   HDL 53 08/07/2020   LDLCALC 84 08/07/2020   ALT 35 06/02/2020   AST 37 06/02/2020   NA 139 02/05/2021   K 3.9 02/05/2021   CL 104 02/05/2021   CREATININE 1.38 (H) 02/05/2021   BUN 24 (H) 02/05/2021   CO2 27 02/05/2021   PSA 0.35 01/07/2015   HGBA1C 5.9 (H) 08/07/2020   MICROALBUR 1.0 01/07/2015     Assessment & Plan:   Problem List Items Addressed This Visit       Cardiovascular and Mediastinum   Hypertension    Stable.  Continue current medications.  Refilled today.      Relevant Medications   amLODipine-benazepril (LOTREL) 10-20 MG capsule   hydrochlorothiazide (HYDRODIURIL) 25 MG tablet   pravastatin (PRAVACHOL) 80 MG tablet   rivaroxaban (XARELTO) 20 MG TABS tablet   sildenafil (REVATIO) 20 MG tablet   Other Relevant Orders   CMP14+EGFR   Atrial fibrillation (HCC)    Stable.  Continue Xarelto.      Relevant Medications   amLODipine-benazepril (LOTREL) 10-20 MG  capsule   hydrochlorothiazide (HYDRODIURIL) 25 MG tablet   pravastatin (PRAVACHOL) 80 MG tablet   rivaroxaban (XARELTO) 20 MG TABS tablet   sildenafil (REVATIO) 20 MG tablet     Endocrine   Diabetes mellitus without complication (HCC)    Diet controlled.      Relevant Medications   amLODipine-benazepril (LOTREL) 10-20 MG capsule   pravastatin (PRAVACHOL) 80 MG tablet   Other Relevant Orders   Hemoglobin A1c     Other   Mixed hyperlipidemia    Well-controlled.  Continue pravastatin.      Relevant Medications   amLODipine-benazepril (LOTREL) 10-20 MG capsule   hydrochlorothiazide (HYDRODIURIL) 25 MG tablet   pravastatin (PRAVACHOL) 80 MG tablet   rivaroxaban (XARELTO) 20 MG TABS tablet   sildenafil (REVATIO) 20 MG tablet   Other Visit Diagnoses     Chronic anticoagulation    -  Primary   Relevant Orders   CBC   Prostate cancer screening       Relevant Orders   PSA       Meds ordered this encounter  Medications   glucose blood test strip    Sig: Use daily as needed to check blood sugar.    Dispense:  100 each    Refill:  12    Please give correct strips for device.   amLODipine-benazepril (LOTREL) 10-20 MG capsule    Sig: Take 1 capsule by mouth daily.    Dispense:  90 capsule    Refill:  1   hydrochlorothiazide (HYDRODIURIL) 25 MG tablet    Sig: Take 1 tablet (25 mg total) by mouth daily.    Dispense:  90 tablet    Refill:  1   potassium chloride SA (KLOR-CON M20) 20 MEQ tablet    Sig: Take 2 tablets (40 mEq total) by mouth daily.    Dispense:  180 tablet    Refill:  1   pravastatin (PRAVACHOL) 80 MG tablet    Sig: Take 1 tablet (80 mg total) by mouth daily.    Dispense:  90 tablet    Refill:  1   rivaroxaban (XARELTO) 20 MG TABS tablet    Sig: TAKE ONE TABLET BY MOUTH DAILY WITH SUPPER    Dispense:  90 tablet    Refill:  1   sildenafil (REVATIO) 20 MG tablet    Sig: TAKE TWO AND ONE-HALF (50  MG TOTAL) TABLETS BY MOUTH AS NEEDED DAILY    Dispense:   30 tablet    Refill:  4   Accu-Chek Softclix Lancets lancets    Sig: Use as instructed    Dispense:  100 each    Refill:  12    Follow-up:  Return in about 6 months (around 06/13/2022).  [place]

## 2021-12-17 NOTE — Assessment & Plan Note (Signed)
Well controlled.  - Continue pravastatin

## 2021-12-17 NOTE — Assessment & Plan Note (Signed)
Stable Continue Xarelto 

## 2021-12-17 NOTE — Assessment & Plan Note (Signed)
Diet controlled.  

## 2021-12-17 NOTE — Assessment & Plan Note (Signed)
Stable.  Continue current medications.  Refilled today. 

## 2021-12-25 LAB — CBC
Hematocrit: 42.3 % (ref 37.5–51.0)
Hemoglobin: 14.6 g/dL (ref 13.0–17.7)
MCH: 30.7 pg (ref 26.6–33.0)
MCHC: 34.5 g/dL (ref 31.5–35.7)
MCV: 89 fL (ref 79–97)
Platelets: 228 10*3/uL (ref 150–450)
RBC: 4.76 x10E6/uL (ref 4.14–5.80)
RDW: 13.1 % (ref 11.6–15.4)
WBC: 4.8 10*3/uL (ref 3.4–10.8)

## 2021-12-25 LAB — HEMOGLOBIN A1C
Est. average glucose Bld gHb Est-mCnc: 131 mg/dL
Hgb A1c MFr Bld: 6.2 % — ABNORMAL HIGH (ref 4.8–5.6)

## 2021-12-25 LAB — CMP14+EGFR
ALT: 33 IU/L (ref 0–44)
AST: 30 IU/L (ref 0–40)
Albumin/Globulin Ratio: 1.5 (ref 1.2–2.2)
Albumin: 4.4 g/dL (ref 3.8–4.8)
Alkaline Phosphatase: 65 IU/L (ref 44–121)
BUN/Creatinine Ratio: 14 (ref 10–24)
BUN: 19 mg/dL (ref 8–27)
Bilirubin Total: 0.3 mg/dL (ref 0.0–1.2)
CO2: 26 mmol/L (ref 20–29)
Calcium: 9.5 mg/dL (ref 8.6–10.2)
Chloride: 102 mmol/L (ref 96–106)
Creatinine, Ser: 1.34 mg/dL — ABNORMAL HIGH (ref 0.76–1.27)
Globulin, Total: 3 g/dL (ref 1.5–4.5)
Glucose: 108 mg/dL — ABNORMAL HIGH (ref 70–99)
Potassium: 3.8 mmol/L (ref 3.5–5.2)
Sodium: 142 mmol/L (ref 134–144)
Total Protein: 7.4 g/dL (ref 6.0–8.5)
eGFR: 60 mL/min/{1.73_m2} (ref >59)

## 2021-12-25 LAB — LIPID PANEL
Chol/HDL Ratio: 3.1 ratio (ref 0.0–5.0)
Cholesterol, Total: 125 mg/dL (ref 100–199)
HDL: 40 mg/dL (ref >39)
LDL Chol Calc (NIH): 74 mg/dL (ref 0–99)
Triglycerides: 47 mg/dL (ref 0–149)
VLDL Cholesterol Cal: 11 mg/dL (ref 5–40)

## 2021-12-25 LAB — PSA: Prostate Specific Ag, Serum: 1.1 ng/mL (ref 0.0–4.0)

## 2022-06-09 ENCOUNTER — Other Ambulatory Visit: Payer: Self-pay | Admitting: Family Medicine

## 2022-06-09 DIAGNOSIS — E782 Mixed hyperlipidemia: Secondary | ICD-10-CM

## 2022-06-09 DIAGNOSIS — I10 Essential (primary) hypertension: Secondary | ICD-10-CM

## 2022-06-13 ENCOUNTER — Ambulatory Visit: Payer: Self-pay | Admitting: Family Medicine

## 2022-06-18 ENCOUNTER — Ambulatory Visit: Payer: Managed Care, Other (non HMO) | Admitting: Family Medicine

## 2022-06-18 ENCOUNTER — Encounter: Payer: Self-pay | Admitting: Family Medicine

## 2022-06-18 VITALS — BP 131/92 | HR 68 | Temp 97.9°F | Wt 225.0 lb

## 2022-06-18 DIAGNOSIS — Z125 Encounter for screening for malignant neoplasm of prostate: Secondary | ICD-10-CM

## 2022-06-18 DIAGNOSIS — E119 Type 2 diabetes mellitus without complications: Secondary | ICD-10-CM

## 2022-06-18 DIAGNOSIS — E782 Mixed hyperlipidemia: Secondary | ICD-10-CM | POA: Diagnosis not present

## 2022-06-18 DIAGNOSIS — Z1211 Encounter for screening for malignant neoplasm of colon: Secondary | ICD-10-CM

## 2022-06-18 DIAGNOSIS — Z7901 Long term (current) use of anticoagulants: Secondary | ICD-10-CM

## 2022-06-18 DIAGNOSIS — I10 Essential (primary) hypertension: Secondary | ICD-10-CM

## 2022-06-18 DIAGNOSIS — M5432 Sciatica, left side: Secondary | ICD-10-CM

## 2022-06-18 DIAGNOSIS — M5430 Sciatica, unspecified side: Secondary | ICD-10-CM | POA: Insufficient documentation

## 2022-06-18 NOTE — Assessment & Plan Note (Signed)
Patient wants to wait on further work-up or intervention at this time.

## 2022-06-18 NOTE — Assessment & Plan Note (Signed)
Stable.  Continue Lotrel.

## 2022-06-18 NOTE — Patient Instructions (Signed)
Labs today.  I have place the referral for colonoscopy.  Follow up in 6 months.  Take care  Dr. Adriana Simas

## 2022-06-18 NOTE — Assessment & Plan Note (Signed)
Has been stable.  Continue pravastatin.  Awaiting lab results.

## 2022-06-18 NOTE — Progress Notes (Signed)
Subjective:  Patient ID: Brian Ewing, male    DOB: [birth year]  Age: 61 y.o. MRN: [id number]  CC: Chief Complaint  Patient presents with   Hypertension    No issues    Diabetes    No issues. Checking sugars periodically. This morning 105. Left leg pain at times-stiffness when getting up     HPI:  61 year old male with atrial fibrillation, hypertension, type 2 diabetes, erectile dysfunction, hyperlipidemia presents for follow-up.  Patient states that overall he is doing well.  Blood pressure has been stable on amlodipine/benazepril and HCTZ.  Patient reports ongoing issues with sciatica particular on the left side.  Denies back pain but reports numbness in his left lower extremity.  He states that this is somewhat troublesome but he does not desire any further intervention or work-up at this time.  Blood sugars have been stable.  He checks his blood sugar infrequently.  He states that he checked it this morning and it was 105.  He is currently not on any pharmacotherapy regarding diabetes.  Lipids have been stable on pravastatin.  Patient Active Problem List   Diagnosis Date Noted   Sciatica 06/18/2022   Atrial fibrillation ([place]) 08/07/2020   Diabetes mellitus without complication ([place]) 88/91/6945   Erectile dysfunction 10/20/2013   Hypertension 01/29/2013   Mixed hyperlipidemia 01/29/2013    Social Hx   Social History   Socioeconomic History   Marital status: Married    Spouse name: Not on file   Number of children: Not on file   Years of education: Not on file   Highest education level: Not on file  Occupational History   Not on file  Tobacco Use   Smoking status: Never   Smokeless tobacco: Never  Substance and Sexual Activity   Alcohol use: No    Alcohol/week: 0.0 standard drinks of alcohol   Drug use: No   Sexual activity: Not on file  Other Topics Concern   Not on file  Social History Narrative   Not on file   Social Determinants of Health    Financial Resource Strain: Not on file  Food Insecurity: Not on file  Transportation Needs: Not on file  Physical Activity: Not on file  Stress: Not on file  Social Connections: Not on file    Review of Systems  Constitutional: Negative.   Respiratory: Negative.    Cardiovascular: Negative.    Objective:  BP (!) 131/92   Pulse 68   Temp 97.9 F (36.6 C)   Wt 225 lb (102.1 kg)   SpO2 99%   BMI 29.69 kg/m      06/18/2022    9:18 AM 12/14/2021    2:56 PM 02/05/2021    9:02 AM  BP/Weight  Systolic BP 038 882 800  Diastolic BP 92 74 70  Wt. (Lbs) 225 228 223  BMI 29.69 kg/m2 30.08 kg/m2 29.42 kg/m2    Physical Exam Vitals and nursing note reviewed.  Constitutional:      General: He is not in acute distress.    Appearance: Normal appearance.  HENT:     Head: Normocephalic and atraumatic.  Eyes:     General:        Right eye: No discharge.        Left eye: No discharge.     Conjunctiva/sclera: Conjunctivae normal.  Cardiovascular:     Rate and Rhythm: Normal rate and regular rhythm.     Heart sounds: No murmur heard. Pulmonary:  Effort: Pulmonary effort is normal.     Breath sounds: Normal breath sounds. No wheezing, rhonchi or rales.  Abdominal:     General: There is no distension.     Palpations: Abdomen is soft.     Tenderness: There is no abdominal tenderness.  Neurological:     Mental Status: He is alert.  Psychiatric:        Mood and Affect: Mood normal.        Behavior: Behavior normal.     Lab Results  Component Value Date   WBC 4.8 12/24/2021   HGB 14.6 12/24/2021   HCT 42.3 12/24/2021   PLT 228 12/24/2021   GLUCOSE 108 (H) 12/24/2021   CHOL 125 12/24/2021   TRIG 47 12/24/2021   HDL 40 12/24/2021   LDLCALC 74 12/24/2021   ALT 33 12/24/2021   AST 30 12/24/2021   NA 142 12/24/2021   K 3.8 12/24/2021   CL 102 12/24/2021   CREATININE 1.34 (H) 12/24/2021   BUN 19 12/24/2021   CO2 26 12/24/2021   PSA 0.35 01/07/2015   HGBA1C 6.2 (H)  12/24/2021   MICROALBUR 1.0 01/07/2015     Assessment & Plan:   Problem List Items Addressed This Visit       Cardiovascular and Mediastinum   Hypertension - Primary    Stable.  Continue Lotrel.        Endocrine   Diabetes mellitus without complication ([place])    Stable.  A1c today.      Relevant Orders   CMP14+EGFR   Hemoglobin A1c   Microalbumin / creatinine urine ratio     Nervous and Auditory   Sciatica    Patient wants to wait on further work-up or intervention at this time.        Other   Mixed hyperlipidemia    Has been stable.  Continue pravastatin.  Awaiting lab results.      Relevant Orders   Lipid panel   Other Visit Diagnoses     Encounter for screening colonoscopy       Relevant Orders   Ambulatory referral to Gastroenterology   Chronic anticoagulation       Relevant Orders   CBC   Prostate cancer screening       Relevant Orders   PSA      Follow-up: 6 months  [place]

## 2022-06-18 NOTE — Assessment & Plan Note (Signed)
Stable.  A1c today.

## 2022-06-19 LAB — CMP14+EGFR
ALT: 32 IU/L (ref 0–44)
AST: 30 IU/L (ref 0–40)
Albumin/Globulin Ratio: 1.6 (ref 1.2–2.2)
Albumin: 4.6 g/dL (ref 3.9–4.9)
Alkaline Phosphatase: 50 IU/L (ref 44–121)
BUN/Creatinine Ratio: 15 (ref 10–24)
BUN: 19 mg/dL (ref 8–27)
Bilirubin Total: 0.5 mg/dL (ref 0.0–1.2)
CO2: 24 mmol/L (ref 20–29)
Calcium: 9.7 mg/dL (ref 8.6–10.2)
Chloride: 100 mmol/L (ref 96–106)
Creatinine, Ser: 1.28 mg/dL — ABNORMAL HIGH (ref 0.76–1.27)
Globulin, Total: 2.9 g/dL (ref 1.5–4.5)
Glucose: 96 mg/dL (ref 70–99)
Potassium: 4.1 mmol/L (ref 3.5–5.2)
Sodium: 141 mmol/L (ref 134–144)
Total Protein: 7.5 g/dL (ref 6.0–8.5)
eGFR: 64 mL/min/{1.73_m2} (ref >59)

## 2022-06-19 LAB — CBC
Hematocrit: 46.5 % (ref 37.5–51.0)
Hemoglobin: 16.2 g/dL (ref 13.0–17.7)
MCH: 31 pg (ref 26.6–33.0)
MCHC: 34.8 g/dL (ref 31.5–35.7)
MCV: 89 fL (ref 79–97)
Platelets: 231 10*3/uL (ref 150–450)
RBC: 5.22 x10E6/uL (ref 4.14–5.80)
RDW: 12.7 % (ref 11.6–15.4)
WBC: 3.6 10*3/uL (ref 3.4–10.8)

## 2022-06-19 LAB — LIPID PANEL
Chol/HDL Ratio: 3 ratio (ref 0.0–5.0)
Cholesterol, Total: 149 mg/dL (ref 100–199)
HDL: 49 mg/dL (ref >39)
LDL Chol Calc (NIH): 91 mg/dL (ref 0–99)
Triglycerides: 41 mg/dL (ref 0–149)
VLDL Cholesterol Cal: 9 mg/dL (ref 5–40)

## 2022-06-19 LAB — PSA: Prostate Specific Ag, Serum: 1.2 ng/mL (ref 0.0–4.0)

## 2022-06-19 LAB — MICROALBUMIN / CREATININE URINE RATIO
Creatinine, Urine: 158.6 mg/dL
Microalb/Creat Ratio: 14 mg/g creat (ref 0–29)
Microalbumin, Urine: 22.7 ug/mL

## 2022-06-19 LAB — HEMOGLOBIN A1C
Est. average glucose Bld gHb Est-mCnc: 134 mg/dL
Hgb A1c MFr Bld: 6.3 % — ABNORMAL HIGH (ref 4.8–5.6)

## 2022-07-17 ENCOUNTER — Encounter: Payer: Self-pay | Admitting: Internal Medicine

## 2022-07-24 ENCOUNTER — Other Ambulatory Visit: Payer: Self-pay | Admitting: Family Medicine

## 2022-07-24 DIAGNOSIS — I4891 Unspecified atrial fibrillation: Secondary | ICD-10-CM

## 2022-08-06 LAB — HM DIABETES EYE EXAM

## 2022-08-07 ENCOUNTER — Encounter: Payer: Self-pay | Admitting: Family Medicine

## 2022-08-16 ENCOUNTER — Ambulatory Visit: Payer: Managed Care, Other (non HMO) | Admitting: Internal Medicine

## 2022-09-02 ENCOUNTER — Encounter: Payer: Self-pay | Admitting: Family Medicine

## 2022-10-01 ENCOUNTER — Ambulatory Visit: Payer: Managed Care, Other (non HMO) | Attending: Cardiology | Admitting: Cardiology

## 2022-10-01 ENCOUNTER — Encounter: Payer: Self-pay | Admitting: Cardiology

## 2022-10-01 VITALS — BP 120/70 | HR 116 | Ht 73.0 in | Wt 240.0 lb

## 2022-10-01 DIAGNOSIS — I10 Essential (primary) hypertension: Secondary | ICD-10-CM

## 2022-10-01 DIAGNOSIS — I4892 Unspecified atrial flutter: Secondary | ICD-10-CM | POA: Diagnosis not present

## 2022-10-01 DIAGNOSIS — E782 Mixed hyperlipidemia: Secondary | ICD-10-CM

## 2022-10-01 MED ORDER — METOPROLOL TARTRATE 25 MG PO TABS: 12.5000 mg | ORAL_TABLET | Freq: Two times a day (BID) | ORAL | 3 refills | Status: DC

## 2022-10-01 NOTE — Progress Notes (Signed)
Clinical Summary Brian Ewing is a 61 y.o.male seen today for follow up of the following medical problems.    1. Paroxysmal Aflutter    CHADS2 is 2: HTN, DM2   - Has not required av nodal agent. At original visit HR was low 50s in 2019, since that time 60s to 70s   - no recent palpitations - compliant with meds. - compliant with xarelto.      2. HTN - compliant with meds     3. Hyperlipidemia - 07/2018 TC 143 TG 28 HDL 55 LDL 82 - compliant with statin  06/2022  TC 149 TG 41 HDL 49 LDL 91   SH: works as Merchandiser, retail at [employer], just retired. [employer]   Past Medical History:  Diagnosis Date   Atrial flutter (HCC)    Diabetes mellitus without complication (HCC)    Elevated hemoglobin A1c    Hyperlipidemia    Hypertension      No Known Allergies   Current Outpatient Medications  Medication Sig Dispense Refill   Accu-Chek Softclix Lancets lancets Use as instructed 100 each 12   acetaminophen (TYLENOL) 500 MG tablet Take 500-1,000 mg by mouth every 6 (six) hours as needed for mild pain or moderate pain.     amLODipine-benazepril (LOTREL) 10-20 MG capsule TAKE ONE CAPSULE BY MOUTH DAILY 90 capsule 1   glucose blood test strip Use daily as needed to check blood sugar. 100 each 12   hydrochlorothiazide (HYDRODIURIL) 25 MG tablet TAKE ONE TABLET BY MOUTH DAILY 90 tablet 1   potassium chloride SA (KLOR-CON M20) 20 MEQ tablet Take 2 tablets (40 mEq total) by mouth daily. 180 tablet 1   pravastatin (PRAVACHOL) 80 MG tablet TAKE ONE TABLET BY MOUTH DAILY 90 tablet 1   sildenafil (REVATIO) 20 MG tablet TAKE TWO AND ONE-HALF (50 MG TOTAL) TABLETS BY MOUTH AS NEEDED DAILY 30 tablet 4   XARELTO 20 MG TABS tablet TAKE ONE TABLET BY MOUTH DAILY WITH SUPPER 90 tablet 1   No current facility-administered medications for this visit.     Past Surgical History:  Procedure Laterality Date   ARTHROSCOPY KNEE W/ DRILLING       No Known  Allergies    Family History  Problem Relation Age of Onset   Stomach cancer Father        44 when he passed    Heart disease Father        bypass surgery at some point      Social History Mr. Hilbert reports that he has never smoked. He has never used smokeless tobacco. Mr. Higinbotham reports no history of alcohol use.   Review of Systems CONSTITUTIONAL: No weight loss, fever, chills, weakness or fatigue.  HEENT: Eyes: No visual loss, blurred vision, double vision or yellow sclerae.No hearing loss, sneezing, congestion, runny nose or sore throat.  SKIN: No rash or itching.  CARDIOVASCULAR: per hpi RESPIRATORY: No shortness of breath, cough or sputum.  GASTROINTESTINAL: No anorexia, nausea, vomiting or diarrhea. No abdominal pain or blood.  GENITOURINARY: No burning on urination, no polyuria NEUROLOGICAL: No headache, dizziness, syncope, paralysis, ataxia, numbness or tingling in the extremities. No change in bowel or bladder control.  MUSCULOSKELETAL: No muscle, back pain, joint pain or stiffness.  LYMPHATICS: No enlarged nodes. No history of splenectomy.  PSYCHIATRIC: No history of depression or anxiety.  ENDOCRINOLOGIC: No reports of sweating, cold or heat intolerance. No polyuria or polydipsia.  Marland Kitchen  Physical Examination Today's Vitals   10/01/22 1337  BP: 120/70  Pulse: (!) 116  Weight: 240 lb (108.9 kg)  Height: 6\' 1"  (1.854 m)   Body mass index is 31.66 kg/m.  Gen: resting comfortably, no acute distress HEENT: no scleral icterus, pupils equal round and reactive, no palptable cervical adenopathy,  CV: irreg, tachy, no m/r/g no jvd Resp: Clear to auscultation bilaterally GI: abdomen is soft, non-tender, non-distended, normal bowel sounds, no hepatosplenomegaly MSK: extremities are warm, no edema.  Skin: warm, no rash Neuro:  no focal deficits Psych: appropriate affect   Diagnostic Studies  02/2018 echo Study Conclusions   - Left ventricle: The cavity size  was normal. Wall thickness was   normal. Systolic function was normal. The estimated ejection   fraction was in the range of 55% to 60%. Wall motion was normal;   there were no regional wall motion abnormalities. The study is   not technically sufficient to allow evaluation of LV diastolic   function due to atrial flutter. - Mitral valve: There was mild regurgitation.     Assessment and Plan  1. Paroxysmal Aflutter - EKG today shows aflutter rate 116, he is asymptomatic - he has not been on av nodal agents due to infrequent flutter, and on our original visit when in SR he was in the low 50s. From f/us rates usually more in the 60s and 70s - will start lopressor 12.5mg  bid. Nursing visit 1 week for EKG with vitals. If persistent aflutter may titrate lopressor vs 7 day monitor to see if paroxysmal or persistent and assess rate control. If found to be persistent could consider DCCV, if found intolerant to av nodal agents due to HRs given typical flutter could be an ablation candidate     2. HTN -he is at goal, continue current meds   3. Hyperlipidemia - he is at goal, continue current meds     03-09-1973, M.D.

## 2022-10-01 NOTE — Patient Instructions (Signed)
Medication Instructions:  Your physician has recommended you make the following change in your medication:  -Start Lopressor 12.5 mg tablets twice daily    Labwork: None  Testing/Procedures: None  Follow-Up: Follow up with Dr. Wyline Mood in 3 months. 1 week Nurse Visit- EKG/Vital Check   Any Other Special Instructions Will Be Listed Below (If Applicable).     If you need a refill on your cardiac medications before your next appointment, please call your pharmacy.

## 2022-10-08 ENCOUNTER — Ambulatory Visit: Payer: Managed Care, Other (non HMO) | Attending: Internal Medicine

## 2022-10-08 VITALS — BP 128/82 | HR 65

## 2022-10-08 DIAGNOSIS — I4892 Unspecified atrial flutter: Secondary | ICD-10-CM | POA: Diagnosis not present

## 2022-10-08 NOTE — Progress Notes (Signed)
Nurse visit after starting lopressor 12.5 mg BID one week ago for PA-flutter  EKG sinus rhythm , patient has no complaints.   I will forward EKG to dr.branch for review.

## 2022-10-08 NOTE — Patient Instructions (Signed)
Continue medications as directed.  I will call you with Dr.Branch's recommendations.

## 2022-12-06 ENCOUNTER — Other Ambulatory Visit: Payer: Self-pay | Admitting: Family Medicine

## 2022-12-06 DIAGNOSIS — I10 Essential (primary) hypertension: Secondary | ICD-10-CM

## 2022-12-06 DIAGNOSIS — E782 Mixed hyperlipidemia: Secondary | ICD-10-CM

## 2022-12-10 ENCOUNTER — Other Ambulatory Visit: Payer: Self-pay | Admitting: Family Medicine

## 2022-12-10 DIAGNOSIS — I10 Essential (primary) hypertension: Secondary | ICD-10-CM

## 2022-12-10 MED ORDER — AMLODIPINE BESY-BENAZEPRIL HCL 10-20 MG PO CAPS: 1.0000 | ORAL_CAPSULE | Freq: Every day | ORAL | 3 refills | Status: DC

## 2022-12-11 ENCOUNTER — Other Ambulatory Visit: Payer: Self-pay | Admitting: Family Medicine

## 2022-12-14 ENCOUNTER — Other Ambulatory Visit: Payer: Self-pay | Admitting: Cardiology

## 2022-12-19 ENCOUNTER — Ambulatory Visit: Payer: Managed Care, Other (non HMO) | Admitting: Family Medicine

## 2022-12-19 ENCOUNTER — Encounter: Payer: Self-pay | Admitting: Family Medicine

## 2022-12-19 DIAGNOSIS — N182 Chronic kidney disease, stage 2 (mild): Secondary | ICD-10-CM

## 2022-12-19 DIAGNOSIS — E11319 Type 2 diabetes mellitus with unspecified diabetic retinopathy without macular edema: Secondary | ICD-10-CM

## 2022-12-19 DIAGNOSIS — E119 Type 2 diabetes mellitus without complications: Secondary | ICD-10-CM

## 2022-12-19 DIAGNOSIS — Z125 Encounter for screening for malignant neoplasm of prostate: Secondary | ICD-10-CM | POA: Diagnosis not present

## 2022-12-19 DIAGNOSIS — N5201 Erectile dysfunction due to arterial insufficiency: Secondary | ICD-10-CM

## 2022-12-19 DIAGNOSIS — I10 Essential (primary) hypertension: Secondary | ICD-10-CM

## 2022-12-19 DIAGNOSIS — E782 Mixed hyperlipidemia: Secondary | ICD-10-CM | POA: Diagnosis not present

## 2022-12-19 MED ORDER — SILDENAFIL CITRATE 20 MG PO TABS: 100.0000 mg | ORAL_TABLET | Freq: Every day | ORAL | 1 refills | Status: DC | PRN

## 2022-12-19 NOTE — Patient Instructions (Signed)
Labs today.  Medication refilled.  Follow up in 6 months.  Take care  Dr. Lacinda Axon

## 2022-12-19 NOTE — Assessment & Plan Note (Signed)
Uncontrolled.  Increasing sildenafil.

## 2022-12-19 NOTE — Assessment & Plan Note (Signed)
Lipids have been stable on pravastatin.  Continue.  Lipid panel today.

## 2022-12-19 NOTE — Progress Notes (Signed)
Subjective:  Patient ID: Brian Ewing, male    DOB: 1961/03/23  Age: 62 y.o. MRN: [id number]  CC: Chief Complaint  Patient presents with   Hypertension    ED request for increase in sildenafil   Diabetes    HPI:  62 year old male with atrial fibrillation, hypertension type 2 diabetes, CKD stage II, hyperlipidemia, erectile dysfunction presents for follow-up.  Patient reports that he is having trouble with erectile dysfunction.  He would like an increase in his sildenafil.    Hypertension is well-controlled on amlodipine, benazepril, metoprolol, and HCTZ.  Patient followed by cardiology regarding atrial fibrillation.  Stable on Xarelto and metoprolol.  Needs labs.  A1c has been at goal.  Needs updated A1c.  No pharmacotherapy at this time.  Patient Active Problem List   Diagnosis Date Noted   CKD (chronic kidney disease) stage 2, GFR 60-89 ml/min 12/19/2022   Atrial fibrillation ([place]) 08/07/2020   Diabetes mellitus without complication ([place]) 40/98/1191   Erectile dysfunction 10/20/2013   Hypertension 01/29/2013   Mixed hyperlipidemia 01/29/2013    Social Hx   Social History   Socioeconomic History   Marital status: Married    Spouse name: Not on file   Number of children: Not on file   Years of education: Not on file   Highest education level: Not on file  Occupational History   Not on file  Tobacco Use   Smoking status: Never   Smokeless tobacco: Never  Vaping Use   Vaping Use: Never used  Substance and Sexual Activity   Alcohol use: No    Alcohol/week: 0.0 standard drinks of alcohol   Drug use: No   Sexual activity: Not on file  Other Topics Concern   Not on file  Social History Narrative   Not on file   Social Determinants of Health   Financial Resource Strain: Not on file  Food Insecurity: Not on file  Transportation Needs: Not on file  Physical Activity: Not on file  Stress: Not on file  Social Connections: Not on file    Review of Systems   Constitutional: Negative.   Respiratory: Negative.    Cardiovascular: Negative.     Objective:  BP 129/84   Pulse 75   Temp (!) 97.3 F (36.3 C)   Ht 6\' 1"  (1.854 m)   Wt 240 lb (108.9 kg)   SpO2 99%   BMI 31.66 kg/m      12/19/2022    9:17 AM 10/08/2022    4:01 PM 10/01/2022    1:37 PM  BP/Weight  Systolic BP 478 295 621  Diastolic BP 84 82 70  Wt. (Lbs) 240  240  BMI 31.66 kg/m2  31.66 kg/m2    Physical Exam Vitals and nursing note reviewed.  Constitutional:      General: He is not in acute distress.    Appearance: Normal appearance.  HENT:     Head: Normocephalic and atraumatic.     Nose: Nose normal.     Mouth/Throat:     Pharynx: Oropharynx is clear.  Eyes:     General:        Right eye: No discharge.        Left eye: No discharge.     Conjunctiva/sclera: Conjunctivae normal.  Pulmonary:     Effort: Pulmonary effort is normal.     Breath sounds: Normal breath sounds. No wheezing or rales.  Neurological:     Mental Status: He is alert.  Psychiatric:  Mood and Affect: Mood normal.        Behavior: Behavior normal.     Lab Results  Component Value Date   WBC 3.6 06/18/2022   HGB 16.2 06/18/2022   HCT 46.5 06/18/2022   PLT 231 06/18/2022   GLUCOSE 96 06/18/2022   CHOL 149 06/18/2022   TRIG 41 06/18/2022   HDL 49 06/18/2022   LDLCALC 91 06/18/2022   ALT 32 06/18/2022   AST 30 06/18/2022   NA 141 06/18/2022   K 4.1 06/18/2022   CL 100 06/18/2022   CREATININE 1.28 (H) 06/18/2022   BUN 19 06/18/2022   CO2 24 06/18/2022   PSA 0.35 01/07/2015   HGBA1C 6.3 (H) 06/18/2022   MICROALBUR 1.0 01/07/2015     Assessment & Plan:   Problem List Items Addressed This Visit       Cardiovascular and Mediastinum   Hypertension    Stable.  Continue Lotrel, hydrochlorothiazide, and metoprolol.      Relevant Medications   sildenafil (REVATIO) 20 MG tablet     Endocrine   Diabetes mellitus without complication ([place])    Has been stable.  A1c  today.        Genitourinary   CKD (chronic kidney disease) stage 2, GFR 60-89 ml/min    Assessing renal function today with labs.      Relevant Orders   CBC     Other   Mixed hyperlipidemia    Lipids have been stable on pravastatin.  Continue.  Lipid panel today.      Relevant Medications   sildenafil (REVATIO) 20 MG tablet   Other Relevant Orders   Lipid panel   Erectile dysfunction    Uncontrolled.  Increasing sildenafil.      Other Visit Diagnoses     Type 2 diabetes mellitus with retinopathy, without long-term current use of insulin, macular edema presence unspecified, unspecified laterality, unspecified retinopathy severity ([place])       Relevant Orders   CMP14+EGFR   Hemoglobin A1c   Microalbumin / creatinine urine ratio   Prostate cancer screening       Relevant Orders   PSA       Meds ordered this encounter  Medications   sildenafil (REVATIO) 20 MG tablet    Sig: Take 5 tablets (100 mg total) by mouth daily as needed.    Dispense:  150 tablet    Refill:  1    Follow-up:  Return in about 6 months (around 06/19/2023).  [place]

## 2022-12-19 NOTE — Assessment & Plan Note (Addendum)
Has been stable. A1c today. 

## 2022-12-19 NOTE — Assessment & Plan Note (Signed)
Assessing renal function today with labs.

## 2022-12-19 NOTE — Assessment & Plan Note (Signed)
Stable.  Continue Lotrel, hydrochlorothiazide, and metoprolol.

## 2022-12-21 LAB — LIPID PANEL
Chol/HDL Ratio: 3.3 ratio (ref 0.0–5.0)
Cholesterol, Total: 154 mg/dL (ref 100–199)
HDL: 47 mg/dL (ref >39)
LDL Chol Calc (NIH): 97 mg/dL (ref 0–99)
Triglycerides: 47 mg/dL (ref 0–149)
VLDL Cholesterol Cal: 10 mg/dL (ref 5–40)

## 2022-12-21 LAB — CMP14+EGFR
ALT: 28 IU/L (ref 0–44)
AST: 26 IU/L (ref 0–40)
Albumin/Globulin Ratio: 1.6 (ref 1.2–2.2)
Albumin: 4.5 g/dL (ref 3.9–4.9)
Alkaline Phosphatase: 53 IU/L (ref 44–121)
BUN/Creatinine Ratio: 14 (ref 10–24)
BUN: 18 mg/dL (ref 8–27)
Bilirubin Total: 0.4 mg/dL (ref 0.0–1.2)
CO2: 24 mmol/L (ref 20–29)
Calcium: 9.6 mg/dL (ref 8.6–10.2)
Chloride: 102 mmol/L (ref 96–106)
Creatinine, Ser: 1.29 mg/dL — ABNORMAL HIGH (ref 0.76–1.27)
Globulin, Total: 2.9 g/dL (ref 1.5–4.5)
Glucose: 101 mg/dL — ABNORMAL HIGH (ref 70–99)
Potassium: 4.1 mmol/L (ref 3.5–5.2)
Sodium: 143 mmol/L (ref 134–144)
Total Protein: 7.4 g/dL (ref 6.0–8.5)
eGFR: 63 mL/min/{1.73_m2} (ref >59)

## 2022-12-21 LAB — CBC
Hematocrit: 46.1 % (ref 37.5–51.0)
Hemoglobin: 15.6 g/dL (ref 13.0–17.7)
MCH: 30.8 pg (ref 26.6–33.0)
MCHC: 33.8 g/dL (ref 31.5–35.7)
MCV: 91 fL (ref 79–97)
Platelets: 253 10*3/uL (ref 150–450)
RBC: 5.07 x10E6/uL (ref 4.14–5.80)
RDW: 13.7 % (ref 11.6–15.4)
WBC: 4.5 10*3/uL (ref 3.4–10.8)

## 2022-12-21 LAB — PSA: Prostate Specific Ag, Serum: 0.8 ng/mL (ref 0.0–4.0)

## 2022-12-21 LAB — HEMOGLOBIN A1C
Est. average glucose Bld gHb Est-mCnc: 140 mg/dL
Hgb A1c MFr Bld: 6.5 % — ABNORMAL HIGH (ref 4.8–5.6)

## 2022-12-21 LAB — MICROALBUMIN / CREATININE URINE RATIO
Creatinine, Urine: 233.1 mg/dL
Microalb/Creat Ratio: 8 mg/g creat (ref 0–29)
Microalbumin, Urine: 19.4 ug/mL

## 2023-01-02 ENCOUNTER — Encounter: Payer: Self-pay | Admitting: Cardiology

## 2023-01-02 ENCOUNTER — Ambulatory Visit: Payer: Managed Care, Other (non HMO) | Attending: Cardiology | Admitting: Cardiology

## 2023-01-02 VITALS — BP 136/80 | HR 55 | Ht 73.0 in | Wt 248.0 lb

## 2023-01-02 DIAGNOSIS — I4892 Unspecified atrial flutter: Secondary | ICD-10-CM | POA: Diagnosis not present

## 2023-01-02 DIAGNOSIS — E782 Mixed hyperlipidemia: Secondary | ICD-10-CM | POA: Diagnosis not present

## 2023-01-02 DIAGNOSIS — I10 Essential (primary) hypertension: Secondary | ICD-10-CM

## 2023-01-02 NOTE — Progress Notes (Signed)
Clinical Summary Brian Ewing is a 62 y.o.male seen today for follow up of the following medical problems.    1. Paroxysmal Aflutter - 10/01/22 visit was in aflutter with elevated rates. Started on lopressor 12.80m bid, f/u visit 1 week later with EKG showed back in SR - careful dosing of av nodal agents due to prior bradycardia.     -no recent palpitations - compliant with meds - no bleeding on xarelto   2. HTN - compliant with meds     3. 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     amLODipine-benazepril (LOTREL) 10-20 MG capsule Take 1 capsule by mouth daily. 90 capsule 3   glucose blood test strip Use daily as needed to check blood sugar. 100 each 12   hydrochlorothiazide (HYDRODIURIL) 25 MG tablet TAKE 1 TABLET BY MOUTH DAILY 90 tablet 1   metoprolol tartrate (LOPRESSOR) 25 MG tablet Take 0.5 tablets (12.5 mg total) by mouth 2 (two) times daily. 90 tablet 3   potassium chloride SA (KLOR-CON M20) 20 MEQ tablet TAKE TWO TABLETS BY MOUTH DAILY 180 tablet 3   pravastatin (PRAVACHOL) 80 MG tablet TAKE 1 TABLET BY MOUTH DAILY 90 tablet 1   sildenafil (REVATIO) 20 MG tablet Take 5 tablets (100 mg total) by mouth daily as needed. 150 tablet 1   XARELTO 20 MG TABS tablet TAKE ONE TABLET BY MOUTH DAILY WITH SUPPER 90 tablet 1   No  current facility-administered medications for this visit.     Past Surgical History:  Procedure Laterality Date   ARTHROSCOPY KNEE W/ DRILLING       No Known Allergies    Family History  Problem Relation Age of Onset   Stomach cancer Father        720when he passed    Heart disease Father        bypass surgery at some point      Social History Mr. JWarrreports that he has never smoked. He has never used smokeless tobacco. Mr. JChatmonreports no history of alcohol use.   Review of Systems CONSTITUTIONAL: No weight loss, fever, chills, weakness or fatigue.  HEENT: Eyes: No visual loss, blurred vision, double vision or yellow sclerae.No hearing loss, sneezing, congestion, runny nose or sore throat.  SKIN: No rash or itching.  CARDIOVASCULAR: per hpi RESPIRATORY: No shortness of breath, cough or sputum.  GASTROINTESTINAL: No anorexia, nausea, vomiting or diarrhea. No abdominal pain or blood.  GENITOURINARY: No burning on urination, no polyuria NEUROLOGICAL: No headache, dizziness, syncope, paralysis, ataxia, numbness or tingling in the extremities. No change in bowel or bladder control.  MUSCULOSKELETAL: No muscle, back pain, joint pain or stiffness.  LYMPHATICS: No enlarged nodes. No  history of splenectomy.  PSYCHIATRIC: No history of depression or anxiety.  ENDOCRINOLOGIC: No reports of sweating, cold or heat intolerance. No polyuria or polydipsia.  Marland Kitchen   Physical Examination Today's Vitals   01/02/23 1316  BP: 136/80  Pulse: (!) 55  SpO2: 98%  Weight: 248 lb (112.5 kg)  Height: 6' 1"$  (1.854 m)   Body mass index is 32.72 kg/m.  Gen: resting comfortably, no acute distress HEENT: no scleral icterus, pupils equal round and reactive, no palptable cervical adenopathy,  CV: irreg, no m/rg no jvd Resp: Clear to auscultation bilaterally GI: abdomen is soft, non-tender, non-distended, normal bowel sounds, no hepatosplenomegaly MSK: extremities are warm, no edema.   Skin: warm, no rash Neuro:  no focal deficits Psych: appropriate affect   Diagnostic Studies  02/2018 echo Study Conclusions   - Left ventricle: The cavity size was normal. Wall thickness was   normal. Systolic function was normal. The estimated ejection   fraction was in the range of 55% to 60%. Wall motion was normal;   there were no regional wall motion abnormalities. The study is   not technically sufficient to allow evaluation of LV diastolic   function due to atrial flutter. - Mitral valve: There was mild regurgitation.     Assessment and Plan   1. Paroxysmal Aflutter/acquired thrombophilia - no symptoms - EKG today shows aflutter rate 103. Since asymptomatic and some prior bradycardia in the past in SR we will continue his lopressor at current dose, however would be open to trying higher doses in the future if neccesary -continue xarelto for stroke prevention     2. HTN -overall at goal, continue current meds   3. Hyperlipidemia - at goal, continue current meds    Arnoldo Lenis, M.D.

## 2023-01-02 NOTE — Patient Instructions (Signed)
Medication Instructions:  Continue all current medications.   Labwork: none  Testing/Procedures: none  Follow-Up: 6 months   Any Other Special Instructions Will Be Listed Below (If Applicable).   If you need a refill on your cardiac medications before your next appointment, please call your pharmacy.  

## 2023-03-06 ENCOUNTER — Other Ambulatory Visit: Payer: Self-pay | Admitting: Family Medicine

## 2023-03-06 DIAGNOSIS — I4891 Unspecified atrial fibrillation: Secondary | ICD-10-CM

## 2023-06-04 ENCOUNTER — Other Ambulatory Visit: Payer: Self-pay | Admitting: Family Medicine

## 2023-06-04 DIAGNOSIS — E782 Mixed hyperlipidemia: Secondary | ICD-10-CM

## 2023-06-04 DIAGNOSIS — I10 Essential (primary) hypertension: Secondary | ICD-10-CM

## 2023-06-18 ENCOUNTER — Telehealth: Payer: Self-pay | Admitting: Family Medicine

## 2023-06-18 NOTE — Telephone Encounter (Signed)
Called and spoke with patient and informed patient that Dr Adriana Simas would order labs after his visit tomorrow.

## 2023-06-18 NOTE — Telephone Encounter (Signed)
Patient has 6 month followup tomorrow and wanting to know if he needs labs done.

## 2023-06-19 ENCOUNTER — Ambulatory Visit: Payer: Managed Care, Other (non HMO) | Admitting: Family Medicine

## 2023-06-19 DIAGNOSIS — I10 Essential (primary) hypertension: Secondary | ICD-10-CM

## 2023-06-19 DIAGNOSIS — I4891 Unspecified atrial fibrillation: Secondary | ICD-10-CM | POA: Diagnosis not present

## 2023-06-19 DIAGNOSIS — E119 Type 2 diabetes mellitus without complications: Secondary | ICD-10-CM

## 2023-06-19 NOTE — Assessment & Plan Note (Signed)
BP stable.  Continue current medications. 

## 2023-06-19 NOTE — Progress Notes (Signed)
Subjective:  Patient ID: Brian Ewing, male    DOB: January 02, 1961  Age: 62 y.o. MRN: [id number]  CC: Follow up   HPI:  62 year old male with atrial fibrillation, hypertension, type 2 diabetes, CKD, hyperlipidemia presents for follow-up.  Patient states that overall he is doing well.  No chest pain or shortness of breath.  Blood pressure well-controlled on HCTZ, amlodipine and benazepril, and metoprolol.  Compliant with metoprolol and Xarelto regarding atrial fibrillation.  Patient Not currently on any pharmacotherapy regarding diabetes.  Needs A1c.  Patient Active Problem List   Diagnosis Date Noted   CKD (chronic kidney disease) stage 2, GFR 60-89 ml/min 12/19/2022   Atrial fibrillation (HCC) 08/07/2020   Diabetes mellitus without complication (HCC) 06/27/2014   Erectile dysfunction 10/20/2013   Hypertension 01/29/2013   Mixed hyperlipidemia 01/29/2013    Social Hx   Social History   Socioeconomic History   Marital status: Married    Spouse name: Not on file   Number of children: Not on file   Years of education: Not on file   Highest education level: Not on file  Occupational History   Not on file  Tobacco Use   Smoking status: Never   Smokeless tobacco: Never  Vaping Use   Vaping status: Never Used  Substance and Sexual Activity   Alcohol use: No    Alcohol/week: 0.0 standard drinks of alcohol   Drug use: No   Sexual activity: Not on file  Other Topics Concern   Not on file  Social History Narrative   Not on file   Social Determinants of Health   Financial Resource Strain: Not on file  Food Insecurity: Not on file  Transportation Needs: Not on file  Physical Activity: Not on file  Stress: Not on file  Social Connections: Unknown (08/12/2022)   [facility]   Social Network    Social Network: Not on file    Review of Systems Per HPI  Objective:  BP 122/86   Pulse (!) 49   Temp 97.7 F (36.5 C)   Ht 6\' 1"   (1.854 m)   Wt 248 lb 9.6 oz (112.8 kg)   SpO2 98%   BMI 32.80 kg/m      06/19/2023    9:26 AM 01/02/2023    1:16 PM 12/19/2022    9:17 AM  BP/Weight  Systolic BP 122 136 129  Diastolic BP 86 80 84  Wt. (Lbs) 248.6 248 240  BMI 32.8 kg/m2 32.72 kg/m2 31.66 kg/m2    Physical Exam Vitals and nursing note reviewed.  Constitutional:      General: He is not in acute distress.    Appearance: Normal appearance.  HENT:     Head: Normocephalic and atraumatic.  Eyes:     General:        Right eye: No discharge.        Left eye: No discharge.     Conjunctiva/sclera: Conjunctivae normal.  Cardiovascular:     Comments: Irregularly irregular. Pulmonary:     Effort: Pulmonary effort is normal.     Breath sounds: Normal breath sounds. No wheezing, rhonchi or rales.  Neurological:     Mental Status: He is alert.  Psychiatric:        Mood and Affect: Mood normal.        Behavior: Behavior normal.     Lab Results  Component Value Date   WBC 4.5 12/19/2022   HGB 15.6 12/19/2022  HCT 46.1 12/19/2022   PLT 253 12/19/2022   GLUCOSE 101 (H) 12/19/2022   CHOL 154 12/19/2022   TRIG 47 12/19/2022   HDL 47 12/19/2022   LDLCALC 97 12/19/2022   ALT 28 12/19/2022   AST 26 12/19/2022   NA 143 12/19/2022   K 4.1 12/19/2022   CL 102 12/19/2022   CREATININE 1.29 (H) 12/19/2022   BUN 18 12/19/2022   CO2 24 12/19/2022   PSA 0.35 01/07/2015   HGBA1C 6.5 (H) 12/19/2022   MICROALBUR 1.0 01/07/2015     Assessment & Plan:   Problem List Items Addressed This Visit       Cardiovascular and Mediastinum   Hypertension    BP stable.  Continue current medications.      Atrial fibrillation (HCC)    Patient in A-fib today.  Continue current medication.        Endocrine   Diabetes mellitus without complication (HCC)    A1c today to assess.      Relevant Orders   Hemoglobin A1c   CMP14+EGFR    Follow-up:  6 months   Adriana Simas DO [facility]

## 2023-06-19 NOTE — Assessment & Plan Note (Signed)
Patient in A-fib today.  Continue current medication.

## 2023-06-19 NOTE — Assessment & Plan Note (Signed)
A1c today to assess. 

## 2023-06-19 NOTE — Patient Instructions (Signed)
You're doing well.  Continue your medications.  Follow up in 6 months.  Labs today.

## 2023-07-03 ENCOUNTER — Ambulatory Visit: Payer: Managed Care, Other (non HMO) | Admitting: Cardiology

## 2023-08-23 ENCOUNTER — Other Ambulatory Visit: Payer: Self-pay | Admitting: Cardiology

## 2023-08-23 ENCOUNTER — Other Ambulatory Visit: Payer: Self-pay | Admitting: Family Medicine

## 2023-08-23 DIAGNOSIS — I4891 Unspecified atrial fibrillation: Secondary | ICD-10-CM

## 2023-09-10 ENCOUNTER — Other Ambulatory Visit: Payer: Self-pay | Admitting: Family Medicine

## 2023-10-16 ENCOUNTER — Encounter: Payer: Self-pay | Admitting: Cardiology

## 2023-10-16 ENCOUNTER — Ambulatory Visit: Payer: Managed Care, Other (non HMO) | Attending: Cardiology | Admitting: Cardiology

## 2023-10-16 VITALS — BP 124/78 | HR 77 | Ht 73.0 in | Wt 255.8 lb

## 2023-10-16 DIAGNOSIS — I4892 Unspecified atrial flutter: Secondary | ICD-10-CM | POA: Diagnosis not present

## 2023-10-16 DIAGNOSIS — E782 Mixed hyperlipidemia: Secondary | ICD-10-CM | POA: Diagnosis not present

## 2023-10-16 DIAGNOSIS — I10 Essential (primary) hypertension: Secondary | ICD-10-CM | POA: Diagnosis not present

## 2023-10-16 NOTE — Progress Notes (Signed)
Clinical Summary Brian Ewing is a 62 y.o.male seen today for follow up of the following medical problems.    1.Persistent  Aflutter - 10/01/22 visit was in aflutter with elevated rates. Started on lopressor 12.5mg  bid, f/u visit 1 week later with EKG showed back in SR - careful dosing of av nodal agents due to prior bradycardia.   - denies any palpitations - compliant with meds. No bleeding on xarelto    2. HTN - compliant with meds     3. Hyperlipidemia - 07/2018 TC 143 TG 28 HDL 55 LDL 82   06/2022  TC 149 TG 41 HDL 49 LDL 91 12/2022 TC 154 TG 47 HDL 47 LDL 97 - compliant with pravastatin   SH: works as Merchandiser, retail at [employer], just retired. [employer] Past Medical History:  Diagnosis Date   Atrial flutter (HCC)    Diabetes mellitus without complication (HCC)    Elevated hemoglobin A1c    Hyperlipidemia    Hypertension      No Known Allergies   Current Outpatient Medications  Medication Sig Dispense Refill   hydrochlorothiazide (HYDRODIURIL) 25 MG tablet TAKE 1 TABLET BY MOUTH DAILY 90 tablet 1   pravastatin (PRAVACHOL) 80 MG tablet TAKE 1 TABLET BY MOUTH DAILY 90 tablet 1   Accu-Chek Softclix Lancets lancets Use as instructed 100 each 12   acetaminophen (TYLENOL) 500 MG tablet Take 500-1,000 mg by mouth every 6 (six) hours as needed for mild pain or moderate pain.     amLODipine-benazepril (LOTREL) 10-20 MG capsule Take 1 capsule by mouth daily. 90 capsule 3   glucose blood test strip Use daily as needed to check blood sugar. 100 each 12   metoprolol tartrate (LOPRESSOR) 25 MG tablet TAKE 1/2 TABLET BY MOUTH TWO TIMES A DAY 90 tablet 3   potassium chloride SA (KLOR-CON M20) 20 MEQ tablet TAKE TWO TABLETS BY MOUTH DAILY 180 tablet 3   sildenafil (REVATIO) 20 MG tablet TAKE FIVE TABLETS BY MOUTH DAILY AS NEEDED 150 tablet 0   XARELTO 20 MG TABS tablet TAKE 1 TABLET BY MOUTH DAILY WITH SUPPER 90 tablet 1   No current facility-administered  medications for this visit.     Past Surgical History:  Procedure Laterality Date   ARTHROSCOPY KNEE W/ DRILLING       No Known Allergies    Family History  Problem Relation Age of Onset   Stomach cancer Father        51 when he passed    Heart disease Father        bypass surgery at some point      Social History Brian Ewing reports that he has never smoked. He has never used smokeless tobacco. Brian Ewing reports no history of alcohol use.   Review of Systems CONSTITUTIONAL: No weight loss, fever, chills, weakness or fatigue.  HEENT: Eyes: No visual loss, blurred vision, double vision or yellow sclerae.No hearing loss, sneezing, congestion, runny nose or sore throat.  SKIN: No rash or itching.  CARDIOVASCULAR: per hpi RESPIRATORY: No shortness of breath, cough or sputum.  GASTROINTESTINAL: No anorexia, nausea, vomiting or diarrhea. No abdominal pain or blood.  GENITOURINARY: No burning on urination, no polyuria NEUROLOGICAL: No headache, dizziness, syncope, paralysis, ataxia, numbness or tingling in the extremities. No change in bowel or bladder control.  MUSCULOSKELETAL: No muscle, back pain, joint pain or stiffness.  LYMPHATICS: No enlarged nodes. No history of splenectomy.  PSYCHIATRIC: No history  of depression or anxiety.  ENDOCRINOLOGIC: No reports of sweating, cold or heat intolerance. No polyuria or polydipsia.  Marland Kitchen   Physical Examination Today's Vitals   10/16/23 0901  BP: 124/78  Pulse: 77  SpO2: 97%  Weight: 255 lb 12.8 oz (116 kg)  Height: 6\' 1"  (1.854 m)  PainSc: 0-No pain   Body mass index is 33.75 kg/m.  Gen: resting comfortably, no acute distress HEENT: no scleral icterus, pupils equal round and reactive, no palptable cervical adenopathy,  CV: irreg, no m/rg, no jvd Resp: Clear to auscultation bilaterally GI: abdomen is soft, non-tender, non-distended, normal bowel sounds, no hepatosplenomegaly MSK: extremities are warm, no edema.  Skin:  warm, no rash Neuro:  no focal deficits Psych: appropriate affect   Diagnostic Studies  02/2018 echo Study Conclusions   - Left ventricle: The cavity size was normal. Wall thickness was   normal. Systolic function was normal. The estimated ejection   fraction was in the range of 55% to 60%. Wall motion was normal;   there were no regional wall motion abnormalities. The study is   not technically sufficient to allow evaluation of LV diastolic   function due to atrial flutter. - Mitral valve: There was mild regurgitation.   Assessment and Plan   1. Persistent Aflutter/acquired thrombophilia - no recent symptmos - EKG today shows aflutter in the 90s - careful dosing of av nodal agents due to prior bradycardia - doing well with just rate control alone, continue current meds including xarelto for stroke prevention     2. HTN -he is at goal, continue current meds   3. Hyperlipidemia - he is at goal, continue current meds     Antoine Poche, M.D.

## 2023-10-16 NOTE — Patient Instructions (Signed)

## 2023-10-27 ENCOUNTER — Other Ambulatory Visit: Payer: Self-pay

## 2023-10-27 MED ORDER — GLUCOSE BLOOD VI STRP: ORAL_STRIP | 12 refills | Status: AC

## 2023-10-31 ENCOUNTER — Ambulatory Visit: Payer: Managed Care, Other (non HMO) | Admitting: Nurse Practitioner

## 2023-10-31 ENCOUNTER — Encounter: Payer: Self-pay | Admitting: Nurse Practitioner

## 2023-10-31 VITALS — BP 134/87 | Temp 98.2°F | Ht 73.0 in | Wt 257.6 lb

## 2023-10-31 DIAGNOSIS — R062 Wheezing: Secondary | ICD-10-CM

## 2023-10-31 DIAGNOSIS — R053 Chronic cough: Secondary | ICD-10-CM

## 2023-10-31 MED ORDER — FLUTICASONE PROPIONATE HFA 110 MCG/ACT IN AERO: 2.0000 | INHALATION_SPRAY | Freq: Two times a day (BID) | RESPIRATORY_TRACT | 0 refills | Status: DC

## 2023-10-31 MED ORDER — AZITHROMYCIN 250 MG PO TABS: ORAL_TABLET | ORAL | 0 refills | Status: DC

## 2023-10-31 NOTE — Progress Notes (Signed)
   Subjective:    Patient ID: Brian Ewing, male    DOB: 25-Jul-1961, 62 y.o.   MRN: [id number]  HPI Presents for complaints of cough for more than 3 weeks.  Chest congestion with spells of coughing.  Chest soreness at times with prolonged cough.  No fever.  Slight wheezing especially at rest.  Worse with activity.  Seems to be worse over the past 3 to 4 days.  No ear pain.  No sore throat.  No sinus pressure or pain.  No history of smoking or vaping.  Cough is nonproductive.  Mild fatigue.  Has been using Coricidin HBP cough medication.     Objective:   Physical Exam NAD.  Alert, oriented.  TMs retracted bilaterally, no erythema.  Pharynx clear.  Neck supple with minimal adenopathy.  Lungs clear.  No tachypnea.  Heart regular rate rhythm.  Several episodes of nonproductive slightly wheezy cough noted. Today's Vitals   10/31/23 1557  BP: 134/87  Temp: 98.2 F (36.8 C)  TempSrc: Oral  SpO2: 98%  Weight: 257 lb 9.6 oz (116.8 kg)  Height: 6\' 1"  (1.854 m)   Body mass index is 33.99 kg/m.        Assessment & Plan:  Persistent cough for 3 weeks or longer  Wheezing Meds ordered this encounter  Medications   azithromycin (ZITHROMAX Z-PAK) 250 MG tablet    Sig: Take 2 tablets (500 mg) on  Day 1,  followed by 1 tablet (250 mg) once daily on Days 2 through 5.    Dispense:  6 each    Refill:  0    Supervising Provider:   Lilyan Punt A [[id number]]   fluticasone (FLOVENT HFA) 110 MCG/ACT inhaler    Sig: Inhale 2 puffs into the lungs 2 (two) times daily.    Dispense:  1 each    Refill:  0    Supervising Provider:   Lilyan Punt A [[id number]]   Continue OTC cough medication as directed. Z-Pak as directed. Start Flovent HFA twice daily as directed.  Advised patient to rinse his mouth or brush his teeth after use to avoid oral candidiasis. Warning signs reviewed.  Call back in 1 week if no improvement, go to ED or urgent care sooner if worse.

## 2023-12-01 ENCOUNTER — Other Ambulatory Visit: Payer: Self-pay | Admitting: Family Medicine

## 2023-12-01 ENCOUNTER — Other Ambulatory Visit: Payer: Self-pay

## 2023-12-01 DIAGNOSIS — I10 Essential (primary) hypertension: Secondary | ICD-10-CM

## 2023-12-01 DIAGNOSIS — E782 Mixed hyperlipidemia: Secondary | ICD-10-CM

## 2023-12-09 LAB — HM DIABETES EYE EXAM

## 2023-12-22 ENCOUNTER — Encounter: Payer: Self-pay | Admitting: Family Medicine

## 2023-12-22 ENCOUNTER — Ambulatory Visit: Payer: Managed Care, Other (non HMO) | Admitting: Family Medicine

## 2023-12-22 VITALS — BP 128/72 | HR 77 | Temp 97.7°F | Ht 73.0 in | Wt 256.0 lb

## 2023-12-22 DIAGNOSIS — N182 Chronic kidney disease, stage 2 (mild): Secondary | ICD-10-CM | POA: Diagnosis not present

## 2023-12-22 DIAGNOSIS — Z125 Encounter for screening for malignant neoplasm of prostate: Secondary | ICD-10-CM | POA: Diagnosis not present

## 2023-12-22 DIAGNOSIS — I10 Essential (primary) hypertension: Secondary | ICD-10-CM

## 2023-12-22 DIAGNOSIS — I4891 Unspecified atrial fibrillation: Secondary | ICD-10-CM

## 2023-12-22 DIAGNOSIS — E782 Mixed hyperlipidemia: Secondary | ICD-10-CM

## 2023-12-22 DIAGNOSIS — E118 Type 2 diabetes mellitus with unspecified complications: Secondary | ICD-10-CM | POA: Diagnosis not present

## 2023-12-22 MED ORDER — AMLODIPINE BESY-BENAZEPRIL HCL 10-20 MG PO CAPS: 1.0000 | ORAL_CAPSULE | Freq: Every day | ORAL | 3 refills | Status: DC

## 2023-12-22 MED ORDER — POTASSIUM CHLORIDE CRYS ER 20 MEQ PO TBCR: 40.0000 meq | EXTENDED_RELEASE_TABLET | Freq: Every day | ORAL | 3 refills | Status: DC

## 2023-12-22 NOTE — Progress Notes (Signed)
 Subjective:  Patient ID: Brian Ewing, male    DOB: 04/12/1961  Age: 63 y.o. MRN: [id number]  CC:   Chief Complaint  Patient presents with   6 month follow up - diabetes- no concerns voiced    HPI:  63 year old male presents for follow-up.  Hypertension stable on metoprolol , hydrochlorothiazide , and amlodipine /benazepril .  Type 2 diabetes has been stable.  Needs A1c, foot exam, and urine microalbumin today.  Atrial fibrillation stable on Xarelto  and metoprolol .  Patient denies chest pain or shortness of breath.  He states he has no complaints or concerns at this time.  Patient Active Problem List   Diagnosis Date Noted   Type 2 diabetes mellitus with complications (HCC) 12/22/2023   CKD (chronic kidney disease) stage 2, GFR 60-89 ml/min 12/19/2022   Atrial fibrillation (HCC) 08/07/2020   Erectile dysfunction 10/20/2013   Hypertension 01/29/2013   Mixed hyperlipidemia 01/29/2013    Social Hx   Social History   Socioeconomic History   Marital status: Married    Spouse name: Not on file   Number of children: Not on file   Years of education: Not on file   Highest education level: Not on file  Occupational History   Not on file  Tobacco Use   Smoking status: Never   Smokeless tobacco: Never  Vaping Use   Vaping status: Never Used  Substance and Sexual Activity   Alcohol use: No    Alcohol/week: 0.0 standard drinks of alcohol   Drug use: No   Sexual activity: Not on file  Other Topics Concern   Not on file  Social History Narrative   Not on file   Social Drivers of Health   Financial Resource Strain: Not on file  Food Insecurity: Not on file  Transportation Needs: Not on file  Physical Activity: Not on file  Stress: Not on file  Social Connections: Unknown (08/12/2022)   [facility] from [facility]   Social Network    Social Network: Not on file    Review of Systems Per HPI  Objective:  BP 128/72   Pulse 77   Temp 97.7 F (36.5  C)   Ht 6\' 1"  (1.854 m)   Wt 256 lb (116.1 kg)   SpO2 99%   BMI 33.78 kg/m      12/22/2023    9:14 AM 10/31/2023    3:57 PM 10/16/2023    9:01 AM  BP/Weight  Systolic BP 128 134 124  Diastolic BP 72 87 78  Wt. (Lbs) 256 257.6 255.8  BMI 33.78 kg/m2 33.99 kg/m2 33.75 kg/m2    Physical Exam Vitals and nursing note reviewed.  Constitutional:      General: He is not in acute distress.    Appearance: Normal appearance.  HENT:     Head: Normocephalic and atraumatic.  Eyes:     General:        Right eye: No discharge.        Left eye: No discharge.     Conjunctiva/sclera: Conjunctivae normal.  Cardiovascular:     Rate and Rhythm: Normal rate and regular rhythm.  Pulmonary:     Effort: Pulmonary effort is normal.     Breath sounds: Normal breath sounds. No wheezing, rhonchi or rales.  Feet:     Comments: Diabetic Foot Check -  Appearance - no lesions, ulcers or calluses Skin - no unusual pallor or redness Monofilament testing -  Right - Great toe, medial, central, lateral ball and  posterior foot intact Left - Great toe, medial, central, lateral ball and posterior foot intact  Neurological:     Mental Status: He is alert.  Psychiatric:        Mood and Affect: Mood normal.        Behavior: Behavior normal.     Lab Results  Component Value Date   WBC 4.5 12/19/2022   HGB 15.6 12/19/2022   HCT 46.1 12/19/2022   PLT 253 12/19/2022   GLUCOSE 113 (H) 06/19/2023   CHOL 154 12/19/2022   TRIG 47 12/19/2022   HDL 47 12/19/2022   LDLCALC 97 12/19/2022   ALT 35 06/19/2023   AST 31 06/19/2023   NA 142 06/19/2023   K 4.2 06/19/2023   CL 103 06/19/2023   CREATININE 1.29 (H) 06/19/2023   BUN 18 06/19/2023   CO2 24 06/19/2023   PSA 0.35 01/07/2015   HGBA1C 6.9 (H) 06/19/2023   MICROALBUR 1.0 01/07/2015     Assessment & Plan:   Problem List Items Addressed This Visit       Cardiovascular and Mediastinum   Hypertension   Stable.  Continue current medications.   On Lotrel and potassium refilled.      Relevant Medications   amLODipine -benazepril  (LOTREL) 10-20 MG capsule   Atrial fibrillation (HCC)   Stable.  Continue current medications.      Relevant Medications   amLODipine -benazepril  (LOTREL) 10-20 MG capsule     Endocrine   Type 2 diabetes mellitus with complications (HCC) - Primary   Has been stable.  A1c and urine microalbumin today.  Diabetic foot exam performed.      Relevant Medications   amLODipine -benazepril  (LOTREL) 10-20 MG capsule   Other Relevant Orders   CMP14+EGFR   Hemoglobin A1c   Microalbumin / creatinine urine ratio     Genitourinary   CKD (chronic kidney disease) stage 2, GFR 60-89 ml/min   Relevant Orders   CBC     Other   Mixed hyperlipidemia   Lipid panel today to assess.  Continue statin.      Relevant Medications   amLODipine -benazepril  (LOTREL) 10-20 MG capsule   Other Relevant Orders   Lipid panel   Other Visit Diagnoses       Prostate cancer screening       Relevant Orders   PSA       Meds ordered this encounter  Medications   amLODipine -benazepril  (LOTREL) 10-20 MG capsule    Sig: Take 1 capsule by mouth daily.    Dispense:  90 capsule    Refill:  3   potassium chloride  SA (KLOR-CON  M20) 20 MEQ tablet    Sig: Take 2 tablets (40 mEq total) by mouth daily.    Dispense:  180 tablet    Refill:  3    Follow-up:  Return in about 6 months (around 06/20/2024) for Follow up Chronic medical issues.  Kathleen Papa DO [facility]

## 2023-12-22 NOTE — Patient Instructions (Signed)
 Continue your medications.  Follow up in 6 months.  Take care  Dr. Adriana Simas

## 2023-12-22 NOTE — Assessment & Plan Note (Signed)
 Stable.  Continue current medications.

## 2023-12-22 NOTE — Assessment & Plan Note (Signed)
 Stable.  Continue current medications.  On Lotrel and potassium refilled.

## 2023-12-22 NOTE — Assessment & Plan Note (Signed)
 Has been stable.  A1c and urine microalbumin today.  Diabetic foot exam performed.

## 2023-12-22 NOTE — Assessment & Plan Note (Signed)
Lipid panel today to assess.  Continue statin. 

## 2023-12-23 LAB — CMP14+EGFR
ALT: 34 [IU]/L (ref 0–44)
AST: 22 [IU]/L (ref 0–40)
Albumin: 4.2 g/dL (ref 3.9–4.9)
Alkaline Phosphatase: 61 [IU]/L (ref 44–121)
BUN/Creatinine Ratio: 13 (ref 10–24)
BUN: 17 mg/dL (ref 8–27)
Bilirubin Total: 0.4 mg/dL (ref 0.0–1.2)
CO2: 24 mmol/L (ref 20–29)
Calcium: 9.4 mg/dL (ref 8.6–10.2)
Chloride: 102 mmol/L (ref 96–106)
Creatinine, Ser: 1.32 mg/dL — ABNORMAL HIGH (ref 0.76–1.27)
Globulin, Total: 2.9 g/dL (ref 1.5–4.5)
Glucose: 126 mg/dL — ABNORMAL HIGH (ref 70–99)
Potassium: 4 mmol/L (ref 3.5–5.2)
Sodium: 142 mmol/L (ref 134–144)
Total Protein: 7.1 g/dL (ref 6.0–8.5)
eGFR: 61 mL/min/{1.73_m2} (ref >59)

## 2023-12-23 LAB — LIPID PANEL
Chol/HDL Ratio: 3.3 {ratio} (ref 0.0–5.0)
Cholesterol, Total: 157 mg/dL (ref 100–199)
HDL: 47 mg/dL (ref >39)
LDL Chol Calc (NIH): 100 mg/dL — ABNORMAL HIGH (ref 0–99)
Triglycerides: 48 mg/dL (ref 0–149)
VLDL Cholesterol Cal: 10 mg/dL (ref 5–40)

## 2023-12-23 LAB — CBC
Hematocrit: 49.6 % (ref 37.5–51.0)
Hemoglobin: 16.8 g/dL (ref 13.0–17.7)
MCH: 31.6 pg (ref 26.6–33.0)
MCHC: 33.9 g/dL (ref 31.5–35.7)
MCV: 93 fL (ref 79–97)
Platelets: 238 10*3/uL (ref 150–450)
RBC: 5.32 x10E6/uL (ref 4.14–5.80)
RDW: 13.3 % (ref 11.6–15.4)
WBC: 4.7 10*3/uL (ref 3.4–10.8)

## 2023-12-23 LAB — MICROALBUMIN / CREATININE URINE RATIO
Creatinine, Urine: 165.3 mg/dL
Microalb/Creat Ratio: 25 mg/g{creat} (ref 0–29)
Microalbumin, Urine: 42 ug/mL

## 2023-12-23 LAB — HEMOGLOBIN A1C
Est. average glucose Bld gHb Est-mCnc: 160 mg/dL
Hgb A1c MFr Bld: 7.2 % — ABNORMAL HIGH (ref 4.8–5.6)

## 2023-12-23 LAB — PSA: Prostate Specific Ag, Serum: 0.9 ng/mL (ref 0.0–4.0)

## 2024-01-22 ENCOUNTER — Other Ambulatory Visit: Payer: Self-pay | Admitting: Family Medicine

## 2024-01-28 ENCOUNTER — Other Ambulatory Visit: Payer: Self-pay | Admitting: Family Medicine

## 2024-01-28 DIAGNOSIS — I4891 Unspecified atrial fibrillation: Secondary | ICD-10-CM

## 2024-03-12 ENCOUNTER — Other Ambulatory Visit: Payer: Self-pay | Admitting: Family Medicine

## 2024-03-12 ENCOUNTER — Other Ambulatory Visit: Payer: Self-pay

## 2024-03-12 MED ORDER — SILDENAFIL CITRATE 20 MG PO TABS: ORAL_TABLET | ORAL | 0 refills | Status: DC

## 2024-03-20 ENCOUNTER — Other Ambulatory Visit: Payer: Self-pay | Admitting: Family Medicine

## 2024-03-22 ENCOUNTER — Other Ambulatory Visit: Payer: Self-pay | Admitting: Family Medicine

## 2024-03-22 MED ORDER — SILDENAFIL CITRATE 20 MG PO TABS: ORAL_TABLET | ORAL | 0 refills | Status: DC

## 2024-04-20 ENCOUNTER — Ambulatory Visit: Payer: Managed Care, Other (non HMO) | Attending: Cardiology | Admitting: Cardiology

## 2024-04-20 ENCOUNTER — Encounter: Payer: Self-pay | Admitting: Cardiology

## 2024-04-20 VITALS — BP 124/74 | HR 79 | Ht 73.0 in | Wt 254.3 lb

## 2024-04-20 DIAGNOSIS — I10 Essential (primary) hypertension: Secondary | ICD-10-CM

## 2024-04-20 DIAGNOSIS — I4892 Unspecified atrial flutter: Secondary | ICD-10-CM

## 2024-04-20 DIAGNOSIS — D6869 Other thrombophilia: Secondary | ICD-10-CM

## 2024-04-20 DIAGNOSIS — E782 Mixed hyperlipidemia: Secondary | ICD-10-CM

## 2024-04-20 NOTE — Progress Notes (Signed)
 Clinical Summary Brian Ewing is a 63 y.o.male seen today for follow up of the following medical problems.    1.Persistent  Aflutter - 10/01/22 visit was in aflutter with elevated rates. Started on lopressor  12.5mg  bid, f/u visit 1 week later with EKG showed back in SR - careful dosing of av nodal agents due to prior bradycardia.    -no recent palpitations - compliant with meds. No bleeding on xarelto .    2. HTN - compliant with meds     3. Hyperlipidemia - 07/2018 TC 143 TG 28 HDL 55 LDL 82   06/2022  TC 149 TG 41 HDL 49 LDL 91 12/2022 TC 154 TG 47 HDL 47 LDL 97 - 12/2023 TC 829 TG 48 HDL 47 LDL 100 - compliant with pravastatin   4. DM2 - diet controlled diabetic.    SH: works as Merchandiser, retail at [employer], just retired. [employer]    Past Medical History:  Diagnosis Date   Atrial flutter (HCC)    Diabetes mellitus without complication (HCC)    Elevated hemoglobin A1c    Hyperlipidemia    Hypertension      No Known Allergies   Current Outpatient Medications  Medication Sig Dispense Refill   Accu-Chek Softclix Lancets lancets Use as instructed 100 each 12   amLODipine -benazepril  (LOTREL) 10-20 MG capsule Take 1 capsule by mouth daily. 90 capsule 3   glucose blood test strip Use daily as needed to check blood sugar. 100 each 12   hydrochlorothiazide  (HYDRODIURIL ) 25 MG tablet TAKE 1 TABLET BY MOUTH DAILY 90 tablet 1   metoprolol  tartrate (LOPRESSOR ) 25 MG tablet TAKE 1/2 TABLET BY MOUTH TWO TIMES A DAY 90 tablet 3   potassium chloride  SA (KLOR-CON  M20) 20 MEQ tablet Take 2 tablets (40 mEq total) by mouth daily. 180 tablet 3   pravastatin  (PRAVACHOL ) 80 MG tablet TAKE 1 TABLET BY MOUTH DAILY 90 tablet 1   sildenafil  (REVATIO ) 20 MG tablet TAKE 2 AND 1/2 TABLETS BY MOUTH DAILY AS NEEDED   Strength: 20 MG 120 tablet 0   XARELTO  20 MG TABS tablet TAKE 1 TABLET BY MOUTH DAILY WITH SUPPER 90 tablet 1   No current facility-administered medications for  this visit.     Past Surgical History:  Procedure Laterality Date   ARTHROSCOPY KNEE W/ DRILLING     COLONOSCOPY       No Known Allergies    Family History  Problem Relation Age of Onset   Stomach cancer Father        31 when he passed    Heart disease Father        bypass surgery at some point      Social History Brian Ewing reports that he has never smoked. He has never used smokeless tobacco. Brian Ewing reports no history of alcohol use.    Physical Examination Today's Vitals   04/20/24 0912  BP: 124/74  Pulse: 79  SpO2: 97%  Weight: 254 lb 4.8 oz (115.3 kg)  Height: 6\' 1"  (1.854 m)   Body mass index is 33.55 kg/m.  Gen: resting comfortably, no acute distress HEENT: no scleral icterus, pupils equal round and reactive, no palptable cervical adenopathy,  CV: RRR, no m/rg, no jvd Resp: Clear to auscultation bilaterally GI: abdomen is soft, non-tender, non-distended, normal bowel sounds, no hepatosplenomegaly MSK: extremities are warm, no edema.  Skin: warm, no rash Neuro:  no focal deficits Psych: appropriate affect   Diagnostic Studies  02/2018 echo Study Conclusions   - Left ventricle: The cavity size was normal. Wall thickness was   normal. Systolic function was normal. The estimated ejection   fraction was in the range of 55% to 60%. Wall motion was normal;   there were no regional wall motion abnormalities. The study is   not technically sufficient to allow evaluation of LV diastolic   function due to atrial flutter. - Mitral valve: There was mild regurgitation.   Assessment and Plan   1. Persistent Aflutter/acquired thrombophilia - no recent symptmos - EKG today shows aflutter in the 90s - careful dosing of av nodal agents due to prior bradycardia - doing well with just rate control alone, continue current meds including xarelto  for stroke prevention     2. HTN -he is at goal, continue current meds   3. Hyperlipidemia - he is at  goal, continue current meds     Laurann Pollock, M.D.

## 2024-04-20 NOTE — Patient Instructions (Signed)
 Medication Instructions:  Your physician recommends that you continue on your current medications as directed. Please refer to the Current Medication list given to you today.  *If you need a refill on your cardiac medications before your next appointment, please call your pharmacy*  Lab Work: NONE   If you have labs (blood work) drawn today and your tests are completely normal, you will receive your results only by: MyChart Message (if you have MyChart) OR A paper copy in the mail If you have any lab test that is abnormal or we need to change your treatment, we will call you to review the results.  Testing/Procedures: NONE   Follow-Up: At [facility], you and your health needs are our priority.  As part of our continuing mission to provide you with exceptional heart care, our providers are all part of one team.  This team includes your primary Cardiologist (physician) and Advanced Practice Providers or APPs (Physician Assistants and Nurse Practitioners) who all work together to provide you with the care you need, when you need it.  Your next appointment:   6 month(s)  Provider:   Armida Lander, MD    We recommend signing up for the patient portal called "MyChart".  Sign up information is provided on this After Visit Summary.  MyChart is used to connect with patients for Virtual Visits (Telemedicine).  Patients are able to view lab/test results, encounter notes, upcoming appointments, etc.  Non-urgent messages can be sent to your provider as well.   To learn more about what you can do with MyChart, go to [email or web address].   Other Instructions Thank you for choosing [facility]!

## 2024-06-06 ENCOUNTER — Other Ambulatory Visit: Payer: Self-pay | Admitting: Family Medicine

## 2024-06-06 DIAGNOSIS — E782 Mixed hyperlipidemia: Secondary | ICD-10-CM

## 2024-06-07 ENCOUNTER — Other Ambulatory Visit: Payer: Self-pay

## 2024-06-07 DIAGNOSIS — E782 Mixed hyperlipidemia: Secondary | ICD-10-CM

## 2024-06-07 MED ORDER — PRAVASTATIN SODIUM 80 MG PO TABS: 80.0000 mg | ORAL_TABLET | Freq: Every day | ORAL | 1 refills | Status: AC

## 2024-06-21 ENCOUNTER — Ambulatory Visit: Payer: Managed Care, Other (non HMO) | Admitting: Family Medicine

## 2024-06-21 VITALS — BP 130/72 | HR 87 | Temp 98.0°F | Ht 73.0 in | Wt 252.0 lb

## 2024-06-21 DIAGNOSIS — I10 Essential (primary) hypertension: Secondary | ICD-10-CM | POA: Diagnosis not present

## 2024-06-21 DIAGNOSIS — N5201 Erectile dysfunction due to arterial insufficiency: Secondary | ICD-10-CM | POA: Diagnosis not present

## 2024-06-21 DIAGNOSIS — E118 Type 2 diabetes mellitus with unspecified complications: Secondary | ICD-10-CM

## 2024-06-21 DIAGNOSIS — E782 Mixed hyperlipidemia: Secondary | ICD-10-CM | POA: Diagnosis not present

## 2024-06-21 DIAGNOSIS — N182 Chronic kidney disease, stage 2 (mild): Secondary | ICD-10-CM | POA: Diagnosis not present

## 2024-06-21 DIAGNOSIS — Z23 Encounter for immunization: Secondary | ICD-10-CM | POA: Diagnosis not present

## 2024-06-21 DIAGNOSIS — I4891 Unspecified atrial fibrillation: Secondary | ICD-10-CM | POA: Diagnosis not present

## 2024-06-21 MED ORDER — TADALAFIL 20 MG PO TABS: 20.0000 mg | ORAL_TABLET | Freq: Every day | ORAL | 11 refills | Status: AC | PRN

## 2024-06-21 MED ORDER — RIVAROXABAN 20 MG PO TABS: 20.0000 mg | ORAL_TABLET | Freq: Every day | ORAL | 1 refills | Status: DC

## 2024-06-21 MED ORDER — AMLODIPINE BESY-BENAZEPRIL HCL 10-20 MG PO CAPS: 1.0000 | ORAL_CAPSULE | Freq: Every day | ORAL | 3 refills | Status: AC

## 2024-06-21 MED ORDER — METOPROLOL TARTRATE 25 MG PO TABS: ORAL_TABLET | ORAL | 3 refills | Status: AC

## 2024-06-21 MED ORDER — HYDROCHLOROTHIAZIDE 25 MG PO TABS: 25.0000 mg | ORAL_TABLET | Freq: Every day | ORAL | 1 refills | Status: DC

## 2024-06-21 MED ORDER — POTASSIUM CHLORIDE CRYS ER 20 MEQ PO TBCR: 40.0000 meq | EXTENDED_RELEASE_TABLET | Freq: Every day | ORAL | 3 refills | Status: AC

## 2024-06-21 NOTE — Progress Notes (Signed)
 Subjective:  Patient ID: Brian Ewing, male    DOB: [birth year]  Age: 63 y.o. MRN: [id number]  CC:   Chief Complaint  Patient presents with   Follow-up    6 month f/u dm2, hypertension, Afib, CKD     HPI:  63 year old male with the below mentioned medical problems presents for follow-up.  Hypertension stable on amlodipine /benazepril , HCTZ, and metoprolol .  Last LDL was 100.  Needs lipid panel today.  Patient on pravastatin .  Last A1c was 7.2.  Needs A1c today.  No current meds for type 2 diabetes.  Patient reports that he is having issues with erectile dysfunction.  He states that currently with sildenafil  he is not having from enough erections for intercourse.  He would like to discuss increasing the dose.  Will also discuss other meds.  Patient Active Problem List   Diagnosis Date Noted   Type 2 diabetes mellitus with complications (HCC) 12/22/2023   CKD (chronic kidney disease) stage 2, GFR 60-89 ml/min 12/19/2022   Atrial fibrillation (HCC) 08/07/2020   Erectile dysfunction 10/20/2013   Hypertension 01/29/2013   Mixed hyperlipidemia 01/29/2013    Social Hx   Social History   Socioeconomic History   Marital status: Married    Spouse name: Not on file   Number of children: Not on file   Years of education: Not on file   Highest education level: Not on file  Occupational History   Not on file  Tobacco Use   Smoking status: Never   Smokeless tobacco: Never  Vaping Use   Vaping status: Never Used  Substance and Sexual Activity   Alcohol use: No    Alcohol/week: 0.0 standard drinks of alcohol   Drug use: No   Sexual activity: Not on file  Other Topics Concern   Not on file  Social History Narrative   Not on file   Social Drivers of Health   Financial Resource Strain: Not on file  Food Insecurity: Not on file  Transportation Needs: Not on file  Physical Activity: Not on file  Stress: Not on file  Social Connections: Unknown (08/12/2022)   Received  from [employer]   Social Network    Social Network: Not on file    Review of Systems Per HPI  Objective:  BP 130/72   Pulse 87   Temp 98 F (36.7 C)   Ht 6' 1 (1.854 m)   Wt 252 lb (114.3 kg)   SpO2 98%   BMI 33.25 kg/m      06/21/2024    9:04 AM 04/20/2024    9:12 AM 12/22/2023    9:14 AM  BP/Weight  Systolic BP 130 124 128  Diastolic BP 72 74 72  Wt. (Lbs) 252 254.3 256  BMI 33.25 kg/m2 33.55 kg/m2 33.78 kg/m2    Physical Exam Vitals and nursing note reviewed.  Constitutional:      General: He is not in acute distress.    Appearance: Normal appearance. He is obese.  HENT:     Head: Normocephalic and atraumatic.  Eyes:     General:        Right eye: No discharge.        Left eye: No discharge.     Conjunctiva/sclera: Conjunctivae normal.  Cardiovascular:     Rate and Rhythm: Normal rate and regular rhythm.  Pulmonary:     Effort: Pulmonary effort is normal.     Breath sounds: Normal breath sounds. No wheezing or rales.  Neurological:  Mental Status: He is alert.  Psychiatric:        Mood and Affect: Mood normal.        Behavior: Behavior normal.     Lab Results  Component Value Date   WBC 4.7 12/22/2023   HGB 16.8 12/22/2023   HCT 49.6 12/22/2023   PLT 238 12/22/2023   GLUCOSE 126 (H) 12/22/2023   CHOL 157 12/22/2023   TRIG 48 12/22/2023   HDL 47 12/22/2023   LDLCALC 100 (H) 12/22/2023   ALT 34 12/22/2023   AST 22 12/22/2023   NA 142 12/22/2023   K 4.0 12/22/2023   CL 102 12/22/2023   CREATININE 1.32 (H) 12/22/2023   BUN 17 12/22/2023   CO2 24 12/22/2023   PSA 0.35 01/07/2015   HGBA1C 7.2 (H) 12/22/2023   MICROALBUR 1.0 01/07/2015     Assessment & Plan:  Primary hypertension Assessment & Plan: Stable.  Continue current medications.  Meds refilled.  Labs today.  Orders: -     hydroCHLOROthiazide ; Take 1 tablet (25 mg total) by mouth daily.  Dispense: 90 tablet; Refill: 1 -     amLODIPine  Besy-Benazepril  HCl; Take 1 capsule by  mouth daily.  Dispense: 90 capsule; Refill: 3 -     CMP14+EGFR  Mixed hyperlipidemia Assessment & Plan: Lipid panel today.  Continue pravastatin .  Orders: -     Lipid panel  Atrial fibrillation, unspecified type [facility]) Assessment & Plan: Follows with cardiology.  Xarelto  refilled.  Continue metoprolol .  Orders: -     Metoprolol  Tartrate; TAKE 1/2 TABLET BY MOUTH TWO TIMES A DAY  Dispense: 90 tablet; Refill: 3 -     Rivaroxaban ; Take 1 tablet (20 mg total) by mouth daily with supper.  Dispense: 90 tablet; Refill: 1  CKD (chronic kidney disease) stage 2, GFR 60-89 ml/min -     CBC  Type 2 diabetes mellitus with complications (HCC) Assessment & Plan: A1c today to assess.  Orders: -     Hemoglobin A1c  Immunization due -     Pneumococcal conjugate vaccine 20-valent  Erectile dysfunction due to arterial insufficiency Assessment & Plan: Having difficulty.  After discussion, trying tadalafil .  Orders: -     Tadalafil ; Take 1 tablet (20 mg total) by mouth daily as needed for erectile dysfunction.  Dispense: 10 tablet; Refill: 11  Other orders -     Potassium Chloride  Crys ER; Take 2 tablets (40 mEq total) by mouth daily.  Dispense: 180 tablet; Refill: 3    Follow-up: 6 months  Donta Fuster Bluford DO [facility]

## 2024-06-21 NOTE — Assessment & Plan Note (Signed)
 Lipid panel today.  Continue pravastatin .

## 2024-06-21 NOTE — Assessment & Plan Note (Signed)
A1c today to assess. 

## 2024-06-21 NOTE — Assessment & Plan Note (Signed)
 Having difficulty.  After discussion, trying tadalafil .

## 2024-06-21 NOTE — Assessment & Plan Note (Signed)
 Follows with cardiology.  Xarelto  refilled.  Continue metoprolol .

## 2024-06-21 NOTE — Patient Instructions (Signed)
 Medications sent in.  Labs today.  Follow up in 6 months.

## 2024-06-21 NOTE — Assessment & Plan Note (Signed)
 Stable.  Continue current medications.  Meds refilled.  Labs today.

## 2024-06-22 ENCOUNTER — Ambulatory Visit: Payer: Self-pay | Admitting: Family Medicine

## 2024-06-22 LAB — CBC
Hematocrit: 47 % (ref 37.5–51.0)
Hemoglobin: 15.8 g/dL (ref 13.0–17.7)
MCH: 31.9 pg (ref 26.6–33.0)
MCHC: 33.6 g/dL (ref 31.5–35.7)
MCV: 95 fL (ref 79–97)
Platelets: 248 x10E3/uL (ref 150–450)
RBC: 4.95 x10E6/uL (ref 4.14–5.80)
RDW: 13.3 % (ref 11.6–15.4)
WBC: 4.1 x10E3/uL (ref 3.4–10.8)

## 2024-06-22 LAB — LIPID PANEL
Chol/HDL Ratio: 3.4 ratio (ref 0.0–5.0)
Cholesterol, Total: 134 mg/dL (ref 100–199)
HDL: 40 mg/dL (ref >39)
LDL Chol Calc (NIH): 83 mg/dL (ref 0–99)
Triglycerides: 49 mg/dL (ref 0–149)
VLDL Cholesterol Cal: 11 mg/dL (ref 5–40)

## 2024-06-22 LAB — CMP14+EGFR
ALT: 32 IU/L (ref 0–44)
AST: 28 IU/L (ref 0–40)
Albumin: 4.2 g/dL (ref 3.9–4.9)
Alkaline Phosphatase: 60 IU/L (ref 44–121)
BUN/Creatinine Ratio: 10 (ref 10–24)
BUN: 13 mg/dL (ref 8–27)
Bilirubin Total: 0.5 mg/dL (ref 0.0–1.2)
CO2: 22 mmol/L (ref 20–29)
Calcium: 9.4 mg/dL (ref 8.6–10.2)
Chloride: 104 mmol/L (ref 96–106)
Creatinine, Ser: 1.27 mg/dL (ref 0.76–1.27)
Globulin, Total: 3 g/dL (ref 1.5–4.5)
Glucose: 146 mg/dL — ABNORMAL HIGH (ref 70–99)
Potassium: 3.9 mmol/L (ref 3.5–5.2)
Sodium: 141 mmol/L (ref 134–144)
Total Protein: 7.2 g/dL (ref 6.0–8.5)
eGFR: 63 mL/min/1.73 (ref >59)

## 2024-06-22 LAB — HEMOGLOBIN A1C
Est. average glucose Bld gHb Est-mCnc: 169 mg/dL
Hgb A1c MFr Bld: 7.5 % — ABNORMAL HIGH (ref 4.8–5.6)

## 2024-06-25 ENCOUNTER — Other Ambulatory Visit: Payer: Self-pay

## 2024-11-26 ENCOUNTER — Ambulatory Visit: Payer: Self-pay | Attending: Cardiology | Admitting: Cardiology

## 2024-11-26 ENCOUNTER — Encounter: Payer: Self-pay | Admitting: Cardiology

## 2024-11-26 VITALS — BP 124/82 | HR 51 | Ht 73.0 in | Wt 250.8 lb

## 2024-11-26 DIAGNOSIS — D6869 Other thrombophilia: Secondary | ICD-10-CM | POA: Diagnosis not present

## 2024-11-26 DIAGNOSIS — E782 Mixed hyperlipidemia: Secondary | ICD-10-CM | POA: Diagnosis not present

## 2024-11-26 DIAGNOSIS — I10 Essential (primary) hypertension: Secondary | ICD-10-CM

## 2024-11-26 DIAGNOSIS — I4892 Unspecified atrial flutter: Secondary | ICD-10-CM

## 2024-11-26 NOTE — Progress Notes (Signed)
 "   Clinical Summary Brian Ewing is a 64 y.o.male seen today for follow up of the following medical problems.    1.Persistent  Aflutter - 10/01/22 visit was in aflutter with elevated rates. Started on lopressor  12.5mg  bid, f/u visit 1 week later with EKG showed back in SR - careful dosing of av nodal agents due to prior bradycardia.    - no recent palpitations - compliant with meds - no bleeding on xarelto .    2. HTN - he is compliant with meds     3. Hyperlipidemia 12/2022 TC 154 TG 47 HDL 47 LDL 97 - 12/2023 TC 842 TG 48 HDL 47 LDL 100 - 06/2024 TC 134 TG 49 HDL 40 LDL 83 - compliant with pravastatin    4. DM2 - diet controlled diabetic.    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 90 capsule 3   glucose blood test strip Use daily as needed to check blood sugar. 100 each 12   hydrochlorothiazide  (HYDRODIURIL ) 25 MG tablet Take 1 tablet (25 mg total) by mouth daily. 90 tablet 1   metoprolol  tartrate (LOPRESSOR ) 25 MG tablet TAKE 1/2 TABLET BY MOUTH TWO TIMES A DAY 90 tablet 3   potassium chloride  SA (KLOR-CON  M20) 20 MEQ tablet Take 2 tablets (40 mEq total) by mouth daily. 180 tablet 3   pravastatin  (PRAVACHOL ) 80 MG tablet Take 1 tablet (80 mg total) by mouth daily. 90 tablet 1   rivaroxaban  (XARELTO ) 20 MG TABS tablet Take 1 tablet (20 mg total) by mouth daily with supper. 90 tablet 1   tadalafil  (CIALIS ) 20 MG tablet Take 1 tablet (20 mg total) by mouth daily as needed  for erectile dysfunction. 10 tablet 11   No current facility-administered medications for this visit.     Past Surgical History:  Procedure Laterality Date   ARTHROSCOPY KNEE W/ DRILLING     COLONOSCOPY       Allergies[2]    Family History  Problem Relation Age of Onset   Stomach cancer Father        5 when he passed    Heart disease Father        bypass surgery at some point      Social History [name] reports that he has never smoked. He has never used smokeless tobacco. [name] reports no history of alcohol use.      Physical Examination Today's Vitals   11/26/24 0901  BP: 124/82  Pulse: (!) 51  SpO2: 97%  Weight: 250 lb 12.8 oz (113.8 kg)  Height: 6' 1 (1.854 m)  PainSc: 0-No pain   Body mass index is 33.09 kg/m.  Gen: resting comfortably, no acute distress HEENT: no scleral icterus, pupils equal round and reactive, no palptable cervical adenopathy,  CV: irreg, no m/rg, no jvd Resp: Clear to auscultation bilaterally GI: abdomen is soft, non-tender, non-distended, normal bowel sounds, no hepatosplenomegaly MSK: extremities are warm, no edema.  Skin: warm, no rash  Neuro:  no focal deficits Psych: appropriate affect   Diagnostic Studies     Assessment and Plan   1. Persistent Aflutter/acquired thrombophilia - no symptoms - EKG today shows 110s, manual HR 94.  - careful dosing of av nodal agents due to prior bradycardia - has done well with just rate control alone - continue current meds including xarelto  for stroke prevention     2. HTN Bp at goal, continue current meds   3. Hyperlipidemia - lipids at goal, continue current meds  F/u 6 months   Dorn PHEBE Ross, M.D.     [1] No Known Allergies [2] No Known Allergies  "

## 2024-11-26 NOTE — Patient Instructions (Signed)
 Medication Instructions:  Continue all current medications.   Labwork: none  Testing/Procedures: none  Follow-Up: 6 months   Any Other Special Instructions Will Be Listed Below (If Applicable).   If you need a refill on your cardiac medications before your next appointment, please call your pharmacy.

## 2024-12-07 ENCOUNTER — Other Ambulatory Visit: Payer: Self-pay | Admitting: Family Medicine

## 2024-12-07 DIAGNOSIS — I10 Essential (primary) hypertension: Secondary | ICD-10-CM

## 2024-12-07 DIAGNOSIS — I4891 Unspecified atrial fibrillation: Secondary | ICD-10-CM

## 2024-12-07 MED ORDER — RIVAROXABAN 20 MG PO TABS: 20.0000 mg | ORAL_TABLET | Freq: Every day | ORAL | 3 refills | Status: AC

## 2024-12-07 MED ORDER — HYDROCHLOROTHIAZIDE 25 MG PO TABS: 25.0000 mg | ORAL_TABLET | Freq: Every day | ORAL | 3 refills | Status: AC

## 2024-12-20 ENCOUNTER — Ambulatory Visit: Admitting: Family Medicine
